# Patient Record
Sex: Female | Born: 1965 | Race: White | Hispanic: No | Smoking: Current every day smoker
Health system: Southern US, Community
[De-identification: ages and names within clinical notes are randomized; demographics above are authoritative.]

## PROBLEM LIST (undated history)

## (undated) DIAGNOSIS — F329 Major depressive disorder, single episode, unspecified: Secondary | ICD-10-CM

## (undated) DIAGNOSIS — K219 Gastro-esophageal reflux disease without esophagitis: Secondary | ICD-10-CM

## (undated) DIAGNOSIS — F32A Depression, unspecified: Secondary | ICD-10-CM

## (undated) DIAGNOSIS — F319 Bipolar disorder, unspecified: Secondary | ICD-10-CM

## (undated) HISTORY — PX: APPENDECTOMY: SHX54

## (undated) HISTORY — PX: CYSTECTOMY: SUR359

## (undated) HISTORY — PX: OOPHORECTOMY: SHX86

## (undated) HISTORY — PX: OVARIAN CYST SURGERY: SHX726

---

## 2000-02-24 ENCOUNTER — Emergency Department (HOSPITAL_COMMUNITY): Admission: EM | Admit: 2000-02-24 | Discharge: 2000-02-24 | Payer: Self-pay | Admitting: Emergency Medicine

## 2000-05-29 ENCOUNTER — Other Ambulatory Visit: Admission: RE | Admit: 2000-05-29 | Discharge: 2000-05-29 | Payer: Self-pay | Admitting: *Deleted

## 2001-09-18 ENCOUNTER — Emergency Department (HOSPITAL_COMMUNITY): Admission: EM | Admit: 2001-09-18 | Discharge: 2001-09-19 | Payer: Self-pay | Admitting: Emergency Medicine

## 2002-03-10 ENCOUNTER — Encounter: Payer: Self-pay | Admitting: Physical Medicine and Rehabilitation

## 2002-03-10 ENCOUNTER — Ambulatory Visit (HOSPITAL_COMMUNITY)
Admission: RE | Admit: 2002-03-10 | Discharge: 2002-03-10 | Payer: Self-pay | Admitting: Physical Medicine and Rehabilitation

## 2003-12-24 ENCOUNTER — Ambulatory Visit: Payer: Self-pay | Admitting: Family Medicine

## 2004-01-11 ENCOUNTER — Emergency Department (HOSPITAL_COMMUNITY): Admission: EM | Admit: 2004-01-11 | Discharge: 2004-01-11 | Payer: Self-pay | Admitting: Emergency Medicine

## 2004-01-12 ENCOUNTER — Ambulatory Visit (HOSPITAL_COMMUNITY): Admission: RE | Admit: 2004-01-12 | Discharge: 2004-01-12 | Payer: Self-pay | Admitting: Emergency Medicine

## 2004-02-10 ENCOUNTER — Ambulatory Visit: Payer: Self-pay | Admitting: Internal Medicine

## 2004-02-10 ENCOUNTER — Inpatient Hospital Stay (HOSPITAL_COMMUNITY): Admission: EM | Admit: 2004-02-10 | Discharge: 2004-02-12 | Payer: Self-pay | Admitting: Emergency Medicine

## 2004-02-19 ENCOUNTER — Inpatient Hospital Stay (HOSPITAL_COMMUNITY): Admission: RE | Admit: 2004-02-19 | Discharge: 2004-02-22 | Payer: Self-pay | Admitting: *Deleted

## 2004-03-24 ENCOUNTER — Ambulatory Visit (HOSPITAL_COMMUNITY): Admission: RE | Admit: 2004-03-24 | Discharge: 2004-03-24 | Payer: Self-pay | Admitting: Internal Medicine

## 2004-03-24 ENCOUNTER — Ambulatory Visit: Payer: Self-pay | Admitting: Internal Medicine

## 2006-08-14 ENCOUNTER — Emergency Department (HOSPITAL_COMMUNITY): Admission: EM | Admit: 2006-08-14 | Discharge: 2006-08-14 | Payer: Self-pay | Admitting: Emergency Medicine

## 2007-03-05 ENCOUNTER — Emergency Department (HOSPITAL_COMMUNITY): Admission: EM | Admit: 2007-03-05 | Discharge: 2007-03-05 | Payer: Self-pay | Admitting: Emergency Medicine

## 2007-09-19 ENCOUNTER — Emergency Department (HOSPITAL_COMMUNITY): Admission: EM | Admit: 2007-09-19 | Discharge: 2007-09-19 | Payer: Self-pay | Admitting: Emergency Medicine

## 2007-12-27 ENCOUNTER — Inpatient Hospital Stay (HOSPITAL_COMMUNITY): Admission: EM | Admit: 2007-12-27 | Discharge: 2007-12-30 | Payer: Self-pay | Admitting: Emergency Medicine

## 2007-12-30 ENCOUNTER — Ambulatory Visit: Payer: Self-pay | Admitting: *Deleted

## 2007-12-30 ENCOUNTER — Inpatient Hospital Stay (HOSPITAL_COMMUNITY): Admission: AD | Admit: 2007-12-30 | Discharge: 2008-01-02 | Payer: Self-pay | Admitting: *Deleted

## 2008-09-02 ENCOUNTER — Emergency Department (HOSPITAL_COMMUNITY): Admission: EM | Admit: 2008-09-02 | Discharge: 2008-09-02 | Payer: Self-pay | Admitting: Emergency Medicine

## 2008-09-03 ENCOUNTER — Emergency Department (HOSPITAL_COMMUNITY): Admission: EM | Admit: 2008-09-03 | Discharge: 2008-09-04 | Payer: Self-pay | Admitting: Emergency Medicine

## 2009-06-23 ENCOUNTER — Encounter (INDEPENDENT_AMBULATORY_CARE_PROVIDER_SITE_OTHER): Payer: Self-pay | Admitting: *Deleted

## 2009-06-23 ENCOUNTER — Ambulatory Visit: Payer: Self-pay | Admitting: Internal Medicine

## 2009-06-23 ENCOUNTER — Encounter: Payer: Self-pay | Admitting: Gastroenterology

## 2009-06-23 DIAGNOSIS — R1310 Dysphagia, unspecified: Secondary | ICD-10-CM | POA: Insufficient documentation

## 2009-06-23 DIAGNOSIS — R5381 Other malaise: Secondary | ICD-10-CM

## 2009-06-23 DIAGNOSIS — R5383 Other fatigue: Secondary | ICD-10-CM

## 2009-06-23 DIAGNOSIS — R1012 Left upper quadrant pain: Secondary | ICD-10-CM

## 2009-06-23 DIAGNOSIS — Z860100 Personal history of colon polyps, unspecified: Secondary | ICD-10-CM | POA: Insufficient documentation

## 2009-06-23 DIAGNOSIS — R6889 Other general symptoms and signs: Secondary | ICD-10-CM

## 2009-06-23 DIAGNOSIS — R1013 Epigastric pain: Secondary | ICD-10-CM

## 2009-06-26 LAB — CONVERTED CEMR LAB
AST: 16 units/L (ref 0–37)
Albumin: 4.4 g/dL (ref 3.5–5.2)
Alkaline Phosphatase: 63 units/L (ref 39–117)
Basophils Relative: 1 % (ref 0–1)
HCT: 44.2 % (ref 36.0–46.0)
Lymphocytes Relative: 36 % (ref 12–46)
Lymphs Abs: 2.9 10*3/uL (ref 0.7–4.0)
MCV: 90 fL (ref 78.0–100.0)
Monocytes Absolute: 0.4 10*3/uL (ref 0.1–1.0)
Monocytes Relative: 5 % (ref 3–12)
Neutro Abs: 4.6 10*3/uL (ref 1.7–7.7)
Neutrophils Relative %: 56 % (ref 43–77)
Platelets: 238 10*3/uL (ref 150–400)
RBC: 4.91 M/uL (ref 3.87–5.11)

## 2009-07-09 ENCOUNTER — Ambulatory Visit: Payer: Self-pay | Admitting: Internal Medicine

## 2009-07-09 ENCOUNTER — Ambulatory Visit (HOSPITAL_COMMUNITY): Admission: RE | Admit: 2009-07-09 | Discharge: 2009-07-09 | Payer: Self-pay | Admitting: Internal Medicine

## 2009-07-12 ENCOUNTER — Encounter: Payer: Self-pay | Admitting: Internal Medicine

## 2009-08-31 ENCOUNTER — Telehealth (INDEPENDENT_AMBULATORY_CARE_PROVIDER_SITE_OTHER): Payer: Self-pay

## 2010-03-16 NOTE — Letter (Signed)
Summary: TCS/EGD ORDER  TCS/EGD ORDER   Imported By: Ave Filter 06/23/2009 11:41:10  _____________________________________________________________________  External Attachment:    Type:   Image     Comment:   External Document

## 2010-03-16 NOTE — Letter (Signed)
Summary: Patient Notice, Colon Biopsy Results  [facility]  [address]   [place], [place] [address]   Phone: [phone]  Fax: [phone]       Jul 12, 2009   [name] [address] [place], [place]  [address] 08/16/1965    Dear [name],  I am pleased to inform you that the biopsies taken during your recent colonoscopy did not show any evidence of cancer upon pathologic examination.  Additional information/recommendations:  You should have a repeat colonoscopy examination  in 5 years.  Please call us if you are having persistent problems or have questions about your condition that have not been fully answered at this time.  Sincerely,    R. Roetta Sessions MD, FACP [facility] Ph: [phone]    Fax: [phone]   Appended Document: Patient Notice, Colon Biopsy Results Letter mailed to pt.  Appended Document: Patient Notice, Colon Biopsy Results reminder in computer

## 2010-03-16 NOTE — Letter (Signed)
Summary: Out of Work Note  [facility]  [address]   [place], [place] [address]   Phone: [phone]  Fax: [phone]    06/23/2009  TO: [name] IT MAY CONCERN  RE: [name] [address] [place] [birth year]       The above named individual is currently under my care and will be out of work    FROM: 06/23/2009   THROUGH: 06/24/2009      If you have any further questions or need additional information, please call.     Sincerely,     [name] R. Roetta Sessions, M.D.    Jonette Eva, M.D. Lorenza Burton, FNP-BC    Tana Coast, PA-C Phone: [phone]    Fax: [phone]

## 2010-03-16 NOTE — Progress Notes (Signed)
Summary: PA for Aciphex?  Phone Note From Pharmacy Call back at [facility] Phone [phone]   Caller: [facility] Summary of Call: pharmacy called- pts insurance wont pay for Aciphex. Formulary are dexilant and nexium.  We only have documentation that pt has taken protonix. tried to call pt to see if ever taken the formulary drugs. LM for return call Initial call taken by: Hendricks Limes LPN,  August 31, 2009 5:00 PM     Appended Document: PA for Aciphex? phone number for insurance PA is [phone]- [facility]  Appended Document: PA for Aciphex? tried to call pt- LMOM  Appended Document: PA for Aciphex? Spoke with Irving Burton at [facility]- informed her that I have been trying to contact pt to get the above information and pt will not return phone call.

## 2010-03-16 NOTE — Letter (Signed)
Summary: Out of Work Note  [facility]  [address]   [place], [place] [address]   Phone: [phone]  Fax: [phone]    06/23/2009  TO: [name] IT MAY CONCERN  RE: [name] [address] [place] [birth year]       The above named individual is currently under my care and will be out of work    FROM: 07/09/2009   THROUGH: 07/10/2009      If you have any further questions or need additional information, please call.     Sincerely,     [facility] R. Roetta Sessions, M.D.    Jonette Eva, M.D. Lorenza Burton, FNP-BC    Tana Coast, PA-C Phone: [phone]    Fax: [phone]

## 2010-03-16 NOTE — Assessment & Plan Note (Signed)
Summary: DIFFICULTY SWALLOWING,CONSULT FOR TCS/SS   Visit Type:  New patient Primary Care Provider:  [facility]  Chief Complaint:  difficulty swallowing and time for tcs.  History of Present Illness: [name] is a pleasant 45 y/o WF, who presents to schedule surveillance TCS. She also has h/o dysphagia. She started having problems with solid food dysphagia one year ago. The last several months, food has been sticking and she coughs it back up. She is having difficulty with liquids now as well. She c/o heartburn on ranitidine. Used to be on protonix but missed some appts at [facility] so RX not renewed. She c/o epig/LUQ pain since 7/10 when she presented to ED with n/v/d. She thinks she broke a rib. CT A/P at that time (7/10) showed apparent mild jejunal wall thickening but felt to most likely be related to lack of enteric contrast opacification.  Mild enteritis (most likely infectious) cannot be excluded but is felt less likely.  Mild fatty infiltration of the liver. Distal esophageal wall thickening which suggests esophagitis.  She denies ongoing n/v. She has 3-4 BMs daily, mostly loose stool but occasionally solid. Denies nocturnal symptoms. No melena, brbpr. No weight loss.    Current Medications (verified): 1)  Xanax .... .5mg  As Needed 2)  Ranitidine Hcl 150 Mg Caps (Ranitidine Hcl) .... As Needed 3)  Lamictal 100 Mg Tabs (Lamotrigine) .... Once Daily 4)  Seroquel 100 Mg Tabs (Quetiapine Fumarate) .... Once Daily 5)  Percocet 5-325 Mg Tabs (Oxycodone-Acetaminophen) .... Three Times A Day 6)  Fish Oil 1000 Mg Caps (Omega-3 Fatty Acids) .... Once Daily 7)  Multivitamins  Tabs (Multiple Vitamin) .... Once Daily 8)  Acetaminophen 500 Mg Tabs (Acetaminophen) .... 3 Per Day 9)  Ibuprofen 200 Mg Tabs (Ibuprofen) .... As Needed, But Significant Amounts, 3-4 Per Day 10)  Goodys Body Pain 500-325 Mg Pack (Aspirin-Acetaminophen) .... Prn  Allergies (verified): 1)  ! Morphine  Past  History:  Past Medical History: Bipolar Disorder Voluntary Behavioral Med adm, 2009. Cocaine relapse, intentional drug overdose (Neurontin) GERD TCS, 2/06 --> normal TCS, 2001--> "precancerous polyps" W-S EGD 12/05-->noncritical Schatzki ring, s/p 3F, mild erosive relux esophagitis, mild chronic gastropathy Chronic right SI joint pain  due to complicated delivery of last child  Past Surgical History: Appendectomy, 2001 during pregnancy Tubal Ligation Hysterectomy, right SOO, 2006 Ovarian cystectomy, 1999 Left SOO, due to cyst.    Family History: Two Cousins with Crohn's Mother, diverticulosis, colon polyps Paternal grandfather, cirrhosis  Social History: 3 daughters. Married. Mindi Slicker. 1ppd. No alcohol use. Recovery cocaine addict, last use 2009.  Review of Systems General:  Complains of fatigue; denies fever, chills, sweats, anorexia, weakness, and weight loss. Eyes:  Denies vision loss. ENT:  Complains of difficulty swallowing; denies nasal congestion, sore throat, and hoarseness. CV:  Denies chest pains, angina, palpitations, dyspnea on exertion, and peripheral edema. Resp:  Denies dyspnea at rest, dyspnea with exercise, cough, sputum, and wheezing. GI:  See HPI. GU:  Denies urinary burning and blood in urine. MS:  Complains of joint pain / LOM. Derm:  Denies rash and itching. Neuro:  Denies weakness, frequent headaches, memory loss, and confusion. Psych:  Complains of depression and anxiety; denies suicidal ideation. Endo:  Complains of cold intolerance; denies unusual weight change. Heme:  Denies bruising and bleeding. Allergy:  Denies hives and rash.  Vital Signs:  Patient profile:   45 year old female Height:      70 inches Weight:  194 pounds BMI:     27.94 Temp:     97.7 degrees F oral Pulse rate:   60 / minute BP sitting:   122 / 84  (left arm) Cuff size:   regular  Vitals Entered By: Hendricks Limes LPN (Jun 23, 2009 10:49 AM)  Physical  Exam  General:  Well developed, well nourished, no acute distress. Head:  Normocephalic and atraumatic. Eyes:  Conjunctivae pink, no scleral icterus.  Mouth:  Oropharyngeal mucosa moist, pink.  No lesions, erythema or exudate.    Neck:  Supple; no masses or thyromegaly. Lungs:  Clear throughout to auscultation. Heart:  Regular rate and rhythm; no murmurs, rubs,  or bruits. Abdomen:  Soft. Positive BS. Moderate epig and LUQ tenderness to deep palpation. No HSM or masses. No abd bruit or hernia. No rebound or guarding.  Rectal:  deferred until time of colonoscopy.   Extremities:  No clubbing, cyanosis, edema or deformities noted. Neurologic:  Alert and  oriented x4;  grossly normal neurologically. Skin:  Intact without significant lesions or rashes. Cervical Nodes:  No significant cervical adenopathy. Psych:  Alert and cooperative. Normal mood and affect.  Impression & Recommendations:  Problem # 1:  COLONIC POLYPS, ADENOMATOUS, HX OF (ICD-V12.72) Due for surveillance TCS. Given polypharmacy, will schedule TCS in OR. Colonoscopy to be performed in near future.  Risks, alternatives, and benefits including but not limited to the risk of reaction to medication, bleeding, infection, and perforation were addressed.  Patient voiced understanding and provided verbal consent.  Orders: T-CBC w/Diff [id number]) New Patient Level III (09811)  Problem # 2:  DYSPHAGIA UNSPECIFIED (ICD-787.20) H/O erosive reflux esophagitis and prior Schatzki ring. She has refractory GERD on H2blocker. Get her back on PPI. Aciphex 20mg  by mouth daily, #20 samples given. EGD/ED to be performed in near future.  Risks, alternatives, benefits including but not limited to risk of reaction to medications, bleeding, infection, and perforation addressed.  Patient voiced understanding and verbal consent obtained. Procedures in OR due to polypharmacy. Orders: New Patient Level III ([id number])  Problem # 3:  EPIGASTRIC PAIN  (ICD-789.06) Nearly one year h/o epigastric pain/LUQ pain. CT 7/10 findings as above. Initial w/u to include EGD and labs. She is on significant amount of NSAIDS/ASA and therefore at risk of PUD. Orders: T-CBC w/Diff [id number]) T-Hepatic Function [phone]) New Patient Level III (28413)  Problem # 4:  FATIGUE (ICD-780.79) She has fatigue and cold intolerance. R/O anemia and thyroid disease. Recommend she schedule f/u appt at [facility].  Orders: T-CBC w/Diff [id number]) T-TSH [id number]) New Patient Level III [phone])

## 2010-05-23 LAB — URINALYSIS, ROUTINE W REFLEX MICROSCOPIC
Glucose, UA: NEGATIVE mg/dL
Nitrite: NEGATIVE
Nitrite: NEGATIVE
Protein, ur: 30 mg/dL — AB
Urobilinogen, UA: 0.2 mg/dL (ref 0.0–1.0)
pH: 6 (ref 5.0–8.0)

## 2010-05-23 LAB — COMPREHENSIVE METABOLIC PANEL
ALT: 20 U/L (ref 0–35)
ALT: 25 U/L (ref 0–35)
AST: 27 U/L (ref 0–37)
AST: 27 U/L (ref 0–37)
Albumin: 4.3 g/dL (ref 3.5–5.2)
Albumin: 4.5 g/dL (ref 3.5–5.2)
Alkaline Phosphatase: 69 U/L (ref 39–117)
CO2: 26 mEq/L (ref 19–32)
Calcium: 9.7 mg/dL (ref 8.4–10.5)
Chloride: 108 mEq/L (ref 96–112)
Chloride: 109 mEq/L (ref 96–112)
Creatinine, Ser: 0.54 mg/dL (ref 0.4–1.2)
Creatinine, Ser: 0.7 mg/dL (ref 0.4–1.2)
GFR calc non Af Amer: >60 mL/min (ref >60)
Glucose, Bld: 137 mg/dL — ABNORMAL HIGH (ref 70–99)
Potassium: 3 mEq/L — ABNORMAL LOW (ref 3.5–5.1)
Potassium: 3.4 mEq/L — ABNORMAL LOW (ref 3.5–5.1)
Sodium: 141 mEq/L (ref 135–145)
Sodium: 142 mEq/L (ref 135–145)
Total Protein: 7.7 g/dL (ref 6.0–8.3)

## 2010-05-23 LAB — DIFFERENTIAL
Basophils Absolute: 0.1 10*3/uL (ref 0.0–0.1)
Basophils Relative: 0 % (ref 0–1)
Eosinophils Absolute: 0 10*3/uL (ref 0.0–0.7)
Eosinophils Absolute: 0 10*3/uL (ref 0.0–0.7)
Lymphocytes Relative: 10 % — ABNORMAL LOW (ref 12–46)
Lymphs Abs: 1.1 10*3/uL (ref 0.7–4.0)
Monocytes Absolute: 0.1 10*3/uL (ref 0.1–1.0)
Monocytes Relative: 1 % — ABNORMAL LOW (ref 3–12)
Neutrophils Relative %: 89 % — ABNORMAL HIGH (ref 43–77)

## 2010-05-23 LAB — URINE MICROSCOPIC-ADD ON

## 2010-05-23 LAB — LIPASE, BLOOD
Lipase: 22 U/L (ref 11–59)
Lipase: 31 U/L (ref 11–59)

## 2010-05-23 LAB — CBC
HCT: 43.8 % (ref 36.0–46.0)
Hemoglobin: 15.3 g/dL — ABNORMAL HIGH (ref 12.0–15.0)
MCHC: 34.3 g/dL (ref 30.0–36.0)
MCV: 89.6 fL (ref 78.0–100.0)
RDW: 13.4 % (ref 11.5–15.5)
RDW: 13.5 % (ref 11.5–15.5)
WBC: 11.2 10*3/uL — ABNORMAL HIGH (ref 4.0–10.5)

## 2010-05-23 LAB — URINE CULTURE: Colony Count: 40000

## 2010-06-29 NOTE — Group Therapy Note (Signed)
[name], [name]:  [id number]   MEDICAL RECORD NO.:  [id number]          PATIENT TYPE:  INP   LOCATION:  A308                          FACILITY:  [facility]   PHYSICIAN:  Dorris Singh, DO    DATE OF BIRTH:  [birth year]   DATE OF PROCEDURE:  12/29/2007  DATE OF DISCHARGE:                                 PROGRESS NOTE   Patient seen today with husband in the room, states she is feeling  better.  We are currently awaiting her placement for inpatient  behavioral therapy.  She has no complaints today.   Temperature 98.2, pulse 82, respirations 18, blood pressure 131/86.  GENERALLY:  The patient is well-developed, well-nourished, no acute  distress.  HEART:  Regular rate and rhythm.  LUNGS:  Clear to auscultation bilaterally.  ABDOMEN:  Soft, nontender, nondistended.  EXTREMITIES:  positive pulses.   LABS:  Her CBC is within normal limits and her chemistry, sodium is 142,  potassium is 4.6, chloride 109, CO2 27, glucose 115, BUN 8 and  creatinine 0.69.   ASSESSMENT/PLAN:  1. Intentional drug overdose.  2. Polysubstance abuse.  3. History of chronic pain.  4. History of panic attacks.  5. History of depression.  6. History question of bipolar disease.   PLAN:  Will be to continue to monitor patient, await ACT team  recommendations for transfer to behavioral health.  Currently she is  medically stable and we just await transfer.      Dorris Singh, DO  Electronically Signed     CB/MEDQ  D:  12/29/2007  T:  12/29/2007  Job:  [id number]
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
## 2010-06-29 NOTE — Discharge Summary (Signed)
NAMETORUNN, CHANCELLOR             ACCOUNT NO.:  [id number]   MEDICAL RECORD NO.:  [id number]          PATIENT TYPE:  INP   LOCATION:  A308                          FACILITY:  [facility]   PHYSICIAN:  Dorris Singh, DO    DATE OF BIRTH:  July 25, 1965   DATE OF ADMISSION:  12/27/2007  DATE OF DISCHARGE:  11/15/2009LH                               DISCHARGE SUMMARY   ADMISSION DIAGNOSES:  1. Intentional drug overdose.  2. Polysubstance abuse.  3. History of chronic pain.  4. History of panic attacks.  5. History of depression.  6. History of questionable bipolar disease.   DISCHARGE DIAGNOSES:  1. Intentional drug overdose.  2. Polysubstance abuse.  3. Suicide ideation.  4. History of chronic pain.  5. History of panic attack.  6. History of depression.   PRIMARY CARE PHYSICIAN:  Dr. Pamelia Hoit.   CONSULT:  Include the ACT team.   TESTING DONE:  She did have any.   /PRESENTING ILLNESS:  Please refer to H&P.  The patient is a 45 year old  Caucasian female who presented with intentional drug overdose.  She took  an unknown amount of Neurontin and states that she took approximately 2  Xanax tablets.   HOSPITAL COURSE:  The patient was admitted to the service of [facility]  and she was placed in the intensive care unit due to positive drug  screen that was noted for being positive for cocaine and her urine drug  screen was positive for benzodiazepines, cocaine, opiates and  tetrahydrocannabinol.  She was seen by the ACT team when she was seen on  day one of admission.  She was very lethargic, however, previous to  seeing  the ACT team representative, she was talking to her husband and,  when she was seen by the ACT team representative, she would not open up  her eyes and would fall asleep.  It was recommended that she be  reevaluated the next day.  From a medical standpoint, the patient  remained to be stable.  Her blood work remained within normal limits and  range.  She  was seen by the ACT team who recommended the next day, on  December 28, 2007,  inpatient therapy for her.  On December 30, 2007, a  bed was obtained at [facility] and  she will be shipped there.   MEDICATIONS PRIOR TO ADMISSION:  1. Effexor XR 75 mg twice a day.  2. Seroquel 100 mg once a day.  3. Xanax 0.5 mg as needed.  4. Climara patch topically.  5. Neurontin no dose given.  6. Percocet.  We will not send her on any medications to [facility] other than  what is listed above.  It will be up to Behavioral Health to continue  those or not.   PHYSICAL EXAM:  Today, she was seen resting comfortably in bed.  She had  no new complaints.  Temperature 98.1, pulse 81, respirations 16, blood pressure 107/68.  GENERAL: The patient is well-developed, well-nourished in no distress.  HEART:  Regular rate and rhythm.  LUNGS: Clear  to auscultation bilaterally.  ABDOMEN:  Soft, nontender.  EXTREMITIES:  Positive pulses.   LABS FOR TODAY:  She did not have any labs done for December 30, 2007  because she has been stable.   DISPOSITION:  Will be to [facility] and her condition  is stable.      Dorris Singh, DO  Electronically Signed     CB/MEDQ  D:  12/30/2007  T:  12/30/2007  Job:  [id number]   cc:   Gloriajean Dell. Andrey Campanile, M.D.  Fax: [phone]

## 2010-07-02 NOTE — Consult Note (Signed)
[name], [name]             ACCOUNT NO.:  [id number]   MEDICAL RECORD NO.:  [id number]          PATIENT TYPE:  INP   LOCATION:  A340                          FACILITY:  [facility]   PHYSICIAN:  R. Roetta Sessions, M.D. DATE OF BIRTH:  1966/01/27   DATE OF CONSULTATION:  02/11/2004  DATE OF DISCHARGE:                                   CONSULTATION   REQUESTING PHYSICIAN:  Dr. Butler Denmark.   REASON FOR CONSULTATION:  Acute gastroenteritis.   HISTORY OF PRESENT ILLNESS:  The patient is a 45 year old Caucasian female  who was admitted with a 6-day history of nausea, vomiting and diarrhea. She  describes the symptoms as acute onset. She denies any chronic nausea,  vomiting, or abdominal pain. She does have a history of IBS and has  postprandial loose stools. Five to six days ago, she developed epigastric  pain. This was followed by several episodes of vomiting. Denies any  hematemesis. Symptoms have persisted the last five to six days. She has also  had diarrhea with this. She is having multiple watery stools daily. Denies  any melena or hematochezia. She complains of diffuse abdominal soreness as  well. She also had a recent ED visit on January 11, 2004 for right lower  quadrant abdominal pain. At that time, she had an ultrasound which revealed  uterine fibroid, 3 x 2.5 x 2.7 solid mass versus hemorrhagic right cyst. She  tells me she has irregular menstrual cycles which are very heavy. She is  actually scheduled for hysterectomy in January. Upon this hospital visit,  she had a CT which revealed probable tiny hepatic cysts, 2.6 cm probable  mildly hemorrhagic right ovarian cyst which was smaller in size than  previous ultrasound. Acute abdominal films revealed mild bronchitic changes.  On admission, her white count was 19,500. Hemoglobin, LFTs, lipase, BUN, and  creatinine normal. Urinalysis was negative. HCG was negative. Today, her  white count is 9,800. She continues to complain of  abdominal pain, nausea,  and vomiting. She has only had one episode of diarrhea since she came on the  floor. This stool was not collected for scheduled studies. She denies any  recent antibiotic use. She does consume well water. She has no ill contacts.  She reports hemoccult negative stool done in the ED.   MEDICATIONS PRIOR TO ADMISSION:  1.  Motrin 800 mg t.i.d.  2.  Effexor 50 mg q.d.  3.  Prevacid 30 mg q.d.  4.  Lortab p.r.n.  5.  Xanax 0.5 mg t.i.d.  6.  Phenergan p.r.n.   ALLERGIES:  MORPHINE caused swelling of the arm in which it was given.   PAST MEDICAL HISTORY:  She has gastroesophageal reflux disease. Her last EGD  was five to six years ago. She does not recall findings. She has had  previously colonoscopies, the last one four years ago. She does have a  history of precancerous polyps and was told to have it done every five  years. She gives a history of IBS with diarrhea. She has depression, chronic  right SI joint pain due to complicated delivery of her  last child. She has  radiculopathy into her right leg. She has been by physical therapy and in  the pain clinic. She takes ibuprofen 800 mg t.i.d. with Lortab p.r.n. for  pain.   PAST SURGICAL HISTORY:  Tubal ligation. She had ovarian cyst removed in  1999. She had a left ovary removed in 1999 due to cyst. She had appendectomy  in 2001 while pregnant with her last child.   FAMILY HISTORY:  She has two cousins with Crohn's disease.   SOCIAL HISTORY:  She is married. She has 3 daughters. 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 She smokes a half pack cigarettes daily. She rarely  consumes alcohol. Denies any illicit drugs.   REVIEW OF SYSTEMS:  See HPI for GI. CARDIOPULMONARY:  Denies any chest pain  or shortness of breath. See HPI for GU.   PHYSICAL EXAMINATION:  VITAL SIGNS:  Height 70 inches, weight 186.7,  temperature 98.8, pulse 76,  respirations 20, blood pressure 99/41.  GENERAL:  Pleasant, well-developed, well-nourished, Caucasian female in no  acute distress.  SKIN:  Warm and dry. No jaundice.  HEENT:  Pupils are equal, round, and reactive to light. Conjunctivae are  pink. Sclerae are nonicteric. Oropharyngeal mucosa somewhat dry. No erythema  or exudate. No lymphadenopathy or thyromegaly.  CHEST:  Lungs are clear to auscultation.  CARDIAC:  Reveals regular rate and rhythm. Normal S1 and S2. No murmurs,  rubs, or gallops.  ABDOMEN:  Positive bowel sounds. Soft, nondistended. She has very mild  diffuse abdominal tenderness in the lower abdomen to deep palpation. She has  moderate epigastric tenderness to deep palpation with subjective guarding.  No rebound tenderness. No hepatosplenomegaly or masses.  EXTREMITIES:  No edema.   LABORATORY DATA:  Today, her white count is 9,800, hemoglobin 12.8,  hematocrit 36.9, platelets 297,000. BUN 7, creatinine 0.7, glucose 96,  sodium 136, potassium 4, lipase 21. Yesterday, her total bilirubin was 0.4,  alkaline phosphatase 65, AST 20, ALT 26, albumin 3.5.   IMPRESSION:  The patient is a 45 year old lady with acute onset nausea,  vomiting, and diarrhea which is probably due to acute gastroenteritis. Stool  studies have not yet been obtained. She also complains of acute epigastric  pain which I suspect is secondary to current illness. Given her significant  ibuprofen use, however, would be concerned regarding gastritis or peptic  ulcer disease although this would not explain all of her current symptoms.  It is somewhat reassuring that she does take Prevacid daily. Gallbladder  remains in situ; however, symptoms are not classical for biliary disease  either. Her last EGD was approximately four years ago. She also has a  history of precancerous colonic polyps and states that she is due for colonoscopy in 2006 for 5-year followup.   RECOMMENDATIONS:  1.  Follow up stool  studies as they are available.  2.  Continue supportive measures.  3.  Consider switching from Dilaudid as the patient complains of headache.  4.  The patient need upper endoscopy if her symptoms persist. She will need      to have an outpatient colonoscopy in the near future given history of      polyps.   I would like to thank Dr. Butler Denmark for allowing [place] to take part in the care of  this patient.     Lesl   LL/MEDQ  D:  02/11/2004  T:  02/11/2004  Job:  [id number]

## 2010-07-02 NOTE — Op Note (Signed)
NAMEANETTA, OLVERA             ACCOUNT NO.:  [id number]   MEDICAL RECORD NO.:  [id number]          PATIENT TYPE:  INP   LOCATION:  A340                          FACILITY:  [facility]   PHYSICIAN:  R. Roetta Sessions, M.D. DATE OF BIRTH:  05/08/1965   DATE OF PROCEDURE:  02/12/2004  DATE OF DISCHARGE:                                 OPERATIVE REPORT   PROCEDURE:  Esophagogastroduodenoscopy with Elease Hashimoto dilation, followed by  biopsy.   INDICATION FOR PROCEDURE:  The patient is a 45 year old lady with a long  history of gastroesophageal reflux disease, admitted to the hospital with  nausea, vomiting, and diarrhea.  She was found to have a right hemorrhagic  ovarian cyst.  Also complains of intermittent esophageal dysphagia.  EGD is  now being done to further evaluate her symptoms.  This approach has been  discussed with the patient at length, potential risks, benefits, and  alternatives have been reviewed, questions answered.  She is agreeable.  Please see the documentation in the medical record for more information.  Prior CT demonstrated a tiny hepatic cyst and the ovarian cyst as noted  above.   PROCEDURE NOTE:  O2 saturation, blood pressure, pulse, and respiration were  monitored throughout the entire procedure.   CONSCIOUS SEDATION:  Versed 7 mg IV, Demerol 125 mg IV in divided doses,  Cetacaine spray for topical pharyngeal anesthesia.   INSTRUMENT USED:  Olympus video colonoscope.   FINDINGS:  Examination of the tubular esophagus revealed a noncritical-  appearing Schatzki's ring and multiple tiny distal esophageal erosions.  The  esophageal mucosa otherwise appeared normal.  EG junction easily traversed.   Stomach:  The gastric cavity was empty and insufflated well with air.  A  thorough examination of the gastric mucosa, including a retroflexed view of  the proximal stomach and esophagogastric junction, was undertaken.  Patient  noted to have an intensely erythematous 2 x 4  cm patch of gastric mucosa in  the prepyloric antral mucosa.  There was no erosion or ulcer craters seen.  Pylorus was patent and easily traversed.  Examination of the bulb and second  portion appeared normal.   Therapeutic/diagnostic maneuvers performed:  A 56 French Maloney dilator was  passed to full insertion.  Subsequently the area of abnormal-appearing  gastric mucosa was biopsied for histologic study.  No apparent complication  related to passage of the dilator was seen.  The patient tolerated the  procedure well and was reacted in endoscopy.   IMPRESSION:  1.  Noncritical-appearing Schatzki's ring.  2.  Tiny distal esophageal erosion consistent with mild erosive reflux      esophagitis, status post Maloney dilation described above.  3.  Patch of intensely erythematous gastric mucosa of uncertain      significance, biopsied.  The remainder of the gastric mucosa appeared      normal.  4  Normal D1, D2.   RECOMMENDATIONS:  1.  Increase proton pump inhibitor therapy to Protonix 40 mg orally b.i.d.      for a month, then back off to once daily.  2.  Advance diet as tolerated.  3.  I agree with GYN evaluation of her right ovarian cyst.  4.  Outpatient colonoscopy at the first of the year to follow up on history      of colonic polyps.  5.  Would avoid nonsteroidal agents as much as possible.     Otelia Sergeant   RMR/MEDQ  D:  02/12/2004  T:  02/12/2004  Job:  [id number]   cc:   Calvert Cantor, M.D.

## 2010-07-02 NOTE — H&P (Signed)
[name], [name]             ACCOUNT NO.:  [id number]   MEDICAL RECORD NO.:  [id number]          PATIENT TYPE:  INP   LOCATION:  A340                          FACILITY:  [facility]   PHYSICIAN:  Margaretmary Dys, M.D.DATE OF BIRTH:  1965-11-14   DATE OF ADMISSION:  02/10/2004  DATE OF DISCHARGE:  LH                                HISTORY & PHYSICAL   ADMISSION DIAGNOSES:  1.  Acute abdominal pain.  2.  Acute gastroenteritis.  3.  Chronic pain syndrome.  4.  Mildly hemorrhagic right ovarian cyst.   CHIEF COMPLAINT:  Nausea, vomiting, diarrhea about six days duration.   HISTORY OF PRESENT ILLNESS:  The patient is a 45 year old Caucasian female  who presented to the emergency room complaining of abdominal pain with  nausea and vomiting. She reports that abdominal pain that is epigastric in  nature, sharp, and nonradiating. She rates it initially as 10/10 when she  came to the hospital but now down to about 6/10 after she received pain  medications. The pain lasted for about three to five minutes before getting  better. She is not aware of any aggravating factors. Pain is nonradiating.  The patient's pain tends to resolve in a waxing and waning manner  spontaneously.   The patient's symptoms started about six days ago but has progressively  worsened. There is similar history in her family, and there is no history of  anyone else sick in the family, even though she has little kids at home.   She states most of her vomitus is yellowish. Denies any blood. She also  reports having loose bowel movements. She says she does have irritable bowel  syndrome and diarrhea is not usual for her, but the consistency of this one  was different and was more frequent. Did not have any abdominal cramping.  Did not have any blood in it. She denies any fever or chills or rigors. She  has no headache, dizziness, or lightheadedness. She denies any myalgia. She  has no frequency, urgency or dysuria. Has  not hematuria. She denies any  shortness of breath. No chest pain. No paroxysmal nocturnal dyspnea. No  orthopnea.   Evaluation in the emergency room including a CT of the abdomen and pelvis  was essentially unremarkable except for a resolving ovarian cyst. The  patient was also noted to have leukocytosis and a left shift, but there was  no clear source of infection other than probable from her diarrhea.   PAST MEDICAL HISTORY:  1.  Chronic back pain.  2.  Anxiety.  3.  Depression.  4.  Status post appendectomy.  5.  Bilateral tubal ligation.  6.  Left ovariectomy.  7.  The patient is scheduled for hysterectomy and also right ovariectomy in      the next several weeks.  8.  Gastroesophageal reflux disease.   CURRENT MEDICATIONS:  1.  Xanax 0.5 mg p.o. t.i.d. p.r.n.  2.  Effexor 50 mg p.o. daily.  3.  Prevacid 30 mg p.o. daily.  4.  Ibuprofen 800 mg up to 6 times a day.  5.  Oxycodone 10  mg 1 to 2 tablets as needed a day.   ALLERGIES:  MORPHINE causes swelling.   FAMILY HISTORY:  Noncontributory.   SOCIAL HISTORY:  The patient is married, has 3 children, the youngest is  about 46 years old. She currently is not employed. She used to be an  ultrasound tech. She is not disability at this time. She lives in  [place]. She smokes about half a pack a day, has about 20-pack-year  history of smoking. Denies any IV drug use.   PHYSICAL EXAMINATION:  GENERAL:  Conscious, alert, pleasant, in mild pain  distress.  VITAL SIGNS:  Blood pressure 149/91, pulse 111, respiratory rate 20,  temperature 96.9.  HEENT:  Normocephalic, atraumatic. Oral mucosa was dry. No exudates was  noted.  NECK:  Supple. No JVD. No lymphadenopathy.  LUNGS:  Clear clinically. Good air entry bilaterally.  HEART:  S1 and S2 regular. No S3, S4, gallops, or rubs.  ABDOMEN:  There is mild epigastric tenderness, was soft, nondistended. There  was no rigidity. No guarding. Bowel sounds were positive.   EXTREMITIES:  No pitting pedal edema. No calf induration or tenderness was  noted.  CENTRAL NERVOUS SYSTEM:  Grossly intact with no focal deficits.   LABORATORY/DIAGNOSTIC STUDIES:  White blood cell count was 19.5, hemoglobin  15.1, hematocrit 44, platelet count 334. There was a left shift with  neutrophils of 90%. Sodium 141, potassium 5.0, chloride 107, CO2 25, glucose  112, BUN 13, creatinine 0.6. Liver function tests were normal. Lipase was  normal at 35. Urinalysis was negative.   CT scan of the abdomen and pelvis shows 1-cm probably resolving hemorrhagic  right ovarian cyst. The patient was noted to have clear lung bases. She was  noted to have tiny liver cysts; otherwise was reported to be a normal exam.   Chest x-ray shows mild bronchitic changes with no acute abnormality.   ASSESSMENT/PLAN:  [name] is a 45 year old Caucasian female who  presented with acute abdominal pain, nausea, vomiting, diarrhea of about six  days duration. It does appear that the patient has some kind of acute  gastroenteritis. She does have elevated white count with left shift. She  does not have a fever though. Would admit and hydrate her at this time.  Would check stools for clostridium difficile and WBC. The patient's lipase  is negative, and her liver function tests are normal. Her exam is not  suggestive of acute cholecystitis at this time. Hence, we will not proceed  with an ultrasound of gallbladder which would have had more sensitivity than  a CT scan, but again, I do not see any indication for this.   I am mostly concerned about acute gastritis in this patient. She has been  taking ibuprofen about 4,000 mg over the last 4 years. I am going to request  gastroenterology consult to evaluate her and probably prepare her for an  endoscopy.   The patient does have resolving right hemorrhagic ovarian cyst. The patient says she was scheduled to have surgery. I do not think this is causing her   pain as anatomically pain is more superior. I will request O&G consult.   We will control pain with Dilaudid, nausea and vomiting with Zofran and  Phenergan. I will continue the patient on anxiolytics. Will give Protonix.   We will repeat lipase in the morning. We will also recheck her CBC in the  morning. I do not see any indication to start any antibiotics at this time.  DVT prophylaxis will be with a sequential compression device while in bed.   I have discussed the above plan with the patient who verbalized full  understanding.     Ayor   AM/MEDQ  D:  02/11/2004  T:  02/11/2004  Job:  [id number]

## 2010-07-02 NOTE — Op Note (Signed)
[name], [name]:  [id number]   MEDICAL RECORD NO.:  [id number]         PATIENT TYPE:  INP   LOCATION:  A409                          FACILITY:  [facility]   PHYSICIAN:  Langley Gauss, MD          DATE OF BIRTH:   DATE OF PROCEDURE:  02/19/2004  DATE OF DISCHARGE:                                 OPERATIVE REPORT   DIAGNOSES:  1.  Menometrorrhagia.  2.  Dysmenorrhea.  3.  Right ovarian cyst.  4.  Mixed-type incontinence.   PROCEDURE PERFORMED:  Total abdominal hysterectomy, right salpingo-  oophorectomy.   SURGEON:  Langley Gauss, MD   ESTIMATED BLOOD LOSS:  200 mL.   ANESTHESIA:  General endotracheal.   SPECIMENS:  For permanent section only.   DRAINS:  JP is placed in the subcutaneous space.  A Foley catheter is  sterilely draining clear yellow urine.   Findings at the time of surgery include adhesive disease and the left adnexa  with the left tube and ovary noted to be surgically absent.  The right ovary  is noted to contain a 3 cm follicular cyst.  The uterine fundus is noted to  be diffusely enlarged.   SUMMARY:  The patient was taken to the operating room, where vital signs  were stable.  The patient underwent uncomplicated induction of general  endotracheal anesthesia, after which time she is sterilely prepped and  draped, a Foley catheter is placed to straight drainage with findings of  clear yellow urine.  A knife is used to incise a Pfannenstiel incision  through the skin.  We dissected down to the fascial plane utilizing a sharp  knife, cauterizing bleeders along the way.  The fascia was then incised in a  transverse curvilinear manner while dissecting off the underlying rectus  muscle in the avascular plane.  This is performed bilaterally.  The edges of  the rectus fascia were then grasped first superiorly, then inferiorly, and  then dissection performed in the midline in the avascular plane between the  fascia and the underlying  rectus muscle to improve our operative exposure.  Rectus muscles then bluntly separated, the peritoneal cavity is  atraumatically bluntly entered at the superiormost portion of the incision.  The peritoneal incision is then extended superiorly and inferiorly.  Inferiorly we directly visualized the bladder to avoid its accidental  injury.  Intrapelvic examination at this time reveals the diffusely  enlarged, bulbous-shaped uterus, surgical absence of the left tube and  ovary, adhesive disease between the large bowel and the left pelvic sidewall  as well as the left fundal portion of the uterus.  The right ovary contains  the previously-described follicular cyst.  Under direct visualization sharp  dissection is then performed in the left adnexal region in the avascular  plane to mobilize the large colon off of the uterine fundus as well as the  left vaginal sidewall.  This is done atraumatically in an avascular plane  with no apparent bowel injury and complete mobilization of the colon at that  point in time.  A Balfour self-retaining retractor is then  placed as well as  two moist packs to mobilize the bowel out of the operative field.  The  bladder blade is  also placed.  The specimen is then identified.  Long  straight Kocher clamps are then used to grasp the broad ligament and  fallopian tubes at their junction with the uterine fundus as well as  manipulation of the specimen throughout the operative procedure.  The left  round ligament is identified first.  It is clamped with a Kelly clamp,  followed by a suture ligature of 0 Vicryl in a Heaney fashion.  The left  round ligament is transected and the dissection is continued anteriorly  across the anterior lower uterine segment in the avascular plane.  This is  to allow skeletonization of the uterine vessel on the patient's left and is  continued anteriorly across the lower uterine segment, which allows  mobilization of the bladder flap  from the vesicouterine fold.  I then turned  my attention to the patient's right side.  The right round ligament is  identified.  It is clamped with a Kelly clamp, followed by a suture ligature  of 0 Vicryl in a Heaney fashion.  Transection of the right round ligament  then allows skeletonization of the right infundibulopelvic ligament.  The  ureter is noted to be in its normal anatomic position along the lateral  pelvic sidewall and free from harm's way.  After skeletonization of the  right infundibulopelvic ligament, it is then doubly clamped with Kelly  clamps, followed by doubly being ligated, first with a 0 Vicryl free tie,  followed by a suture ligature of 0 Vicryl in a Heaney fashion.  This secures  the right infundibulopelvic ligament and allows the ovary and fallopian tube  to be removed with the operative specimen.  On the right side in the  avascular plane, the right uterine vessel is skeletonized.  The right  uterine vessel is identified.  A Kelly clamp is placed to control back  bleeding.  A curved Heaney clamp is then placed to secure the uterine vessel  on the right, placed at the junction of the cervix and   Dictation ended at this point.     Vira Blanco   DC/MEDQ  D:  02/19/2004  T:  02/19/2004  Job:  [id number]

## 2010-07-02 NOTE — Discharge Summary (Signed)
NAMEDELONNA, NEY             ACCOUNT NO.:  [id number]   MEDICAL RECORD NO.:  [id number]          PATIENT TYPE:  INP   LOCATION:  A340                          FACILITY:  [facility]   PHYSICIAN:  Calvert Cantor, M.D.     DATE OF BIRTH:  [birth year]   DATE OF ADMISSION:  02/10/2004  DATE OF DISCHARGE:  12/29/2005LH                                 DISCHARGE SUMMARY   GASTROENTEROLOGIST:  Jonathon Bellows, M.D.   DISCHARGE DIAGNOSIS:  1.  Acute gastroenteritis.  2. Gastroesophageal reflux disease causing      esophagitis.   DISCHARGE MEDICATIONS:  1.  Protonix 40 mg q.12h.  2. Avoid NSAIDs.   HOSPITAL COURSE:  This is a 45 year old white female who was admitted for  nausea and vomiting and diarrhea.  On admission, a CAT scan was done showing  resolving hemorrhagic cyst.  The patient was started on IV fluids and a GI  consult was placed.  Pain control was initiated with Dilaudid.  However, the  patient stated that Dilaudid gave her a headache.  Therefore, the next day  when she mentioned this it was changed to Demerol.  Nausea and vomiting was  controlled with Zofran and Phenergan.  Protonix was started for GI  protection.  Her white count which was initially 19.5 dropped down to 9.8 on  the following day and 7.6 on the day of discharge.  Her hemoglobin initially  15.1, dropped down to 12.8 and 12.3 which was probably secondary to  hemodilution.  This is probably her baseline.  MCV on discharge was 89.4.  Platelets were 282.  Sodium on discharge was 141, potassium was 4.1,  chloride 108, bicarbonate 28, glucose 94, BUN 4, creatinine 0.7.  LFTs were  normal.  Lipase was normal.  The patient had an EGD by Dr. Jena Gauss.  The  results are as follows:  1. Noncritical-appearing Schatzki's ring.  2. Tiny  distal esophageal erosion consistent with mild erosive reflux esophagitis  and a patch of intensely erythematous gastric mucosa of uncertain  significance which was biopsied.  It is recommended  that her proton pump  inhibitor be increased to twice a day and her diet be advanced as tolerated.  The patient was able to tolerate dinner that night.  Her nausea and vomiting  had improved.  So had her diarrhea.  This was most likely secondary to  gastroenteritis.   FOLLOW UP:  She is to follow up with Dr. Jena Gauss for the biopsy results.  She  is to follow up with her OB/GYN for her hemorrhagic ovarian cyst.  Dr. Jena Gauss  also plans on doing a colonoscopy soon because of her history of colon  polyps.  I have told her that she is to avoid NSAIDs.     Saim   SR/MEDQ  D:  02/13/2004  T:  02/13/2004  Job:  [id number]

## 2010-07-02 NOTE — Op Note (Signed)
[name], [name] NO.:  [id number]   MEDICAL RECORD NO.:  [id number]         PATIENT TYPE:  INP   LOCATION:  A409                          FACILITY:  [facility]   PHYSICIAN:  Langley Gauss, MD     DATE OF BIRTH:  04-03-1965   DATE OF PROCEDURE:  02/19/2004  DATE OF DISCHARGE:                                 OPERATIVE REPORT   ADDENDUM:  The right infundibulopelvic ligament is secured with the Vicryl  suture. Skeletization of the right uterine vessel was performed.  A curved  Heaney clamp was placed at the junction of the cervix and the body of the  uterus, placing it as closed as possible to this portion of the specimen. A  single ligature of #0 Vicryl suture is then placed to secure the right  uterine vessel.  The left uterine vessel had previously been skeletonized.  A single curved Heaney clamp is placed at the left uterine vessel at the  junction of the cervix and body of the uterus itself. This pedicle is then  secured with a #0 Vicryl suture.  The major vascular supply to the uterus  has now been secured, thus there is significant blanching and marked  decreased blood loss noted from backbleeding from the specimen.   The patient's right is then visualized.  A straight Heaney clamp is placed  to secure the cardinal ligament followed by suture ligature of #0 Vicryl in  a Heaney fashion.  Likewise on the left a straight Heaney clamp is used to  secure the left cardinal ligament followed by suture ligature of #0 Vicryl  in a Heaney fashion.  These were placed very close as possible to the cervix  itself.   The right uterosacral ligament is then clamped with a straight Heaney clamp  followed by suture ligature of #0 Vicryl in a Heaney fashion. This is tagged  for a later use.  The left ureterosacral ligament was likewise clamped with  a straight Heaney clamp followed by suture ligature of #0 Vicryl in a Heaney  fashion; this is likewise tagged for later  inspection. The right vaginal  angle is then identified.  A curved Heaney clamp is placed here to secure  the vaginal angle which is then, likewise, tagged for later inspection.  This then allows me to dissect across the vaginal cuff, maximizing cervical  length, the entirety of the cervix with the operative specimen.  The  specimen is then examined.  The cervix is noted to be contained in its  entirety with the handed off specimen.   The vaginal cuff was then grasped utilizing straight Kocher clamps.  Irrigation was performed of the upper vagina with Betadine solution and the  vaginal cuff was completely closed utilizing sequential four figure-of-eight  sutures of #0 Vicryl. An additional figure-of-eight of #0 Vicryl was placed  to reenforce the vaginal cuff at the level of the right vaginal angle.  The  bladder flap was noted to fall nicely over; and the copious irrigation then  performed until clear.  The Balfour retractors were removed as well as moist  packs.  Sponge, needle, and instrument counts were correct x2 at this point.  Thus the peritoneal edges were grasped using Kelly clamps.  Large bowel with  overlying omentum is placed within the pelvic cavity.  The peritoneum is  then closed with a continuous running #0 Vicryl suture which is likewise  noted to reapproximated the rectus muscle in the midline.  The fascia was  closed using running #1 PDS suture for secure closure.  The subcutaneous  bleeders are cauterized.  A JP drain is placed in the subcutaneous space  __________ #1 PDS were then placed through-and-through the skin to  facilitate closure and function as retention-type sutures. The skin is then  completely closed utilizing the skin staples.  A total of about 30 mL of  0.5%  bupivacaine plain was then injected along the entirety of the incision.  Operative procedure was then terminated.  The patient's vital signs remain  stable.  She is reversed of anesthesia; taken to  the recovery room in stable  condition at which time operative findings were discussed with the patient's  awaiting family.     [name]   DC/MEDQ  D:  02/19/2004  T:  02/19/2004  Job:  [id number]

## 2010-07-02 NOTE — H&P (Signed)
NAMEMARICE, [name]:  [id number]   MEDICAL RECORD NO.:  [id number]           PATIENT TYPE:   LOCATION:                                FACILITY:  [facility]   PHYSICIAN:  Langley Gauss, MD     DATE OF BIRTH:  [birth year]   DATE OF ADMISSION:  02/19/2004  DATE OF DISCHARGE:  LH                                HISTORY & PHYSICAL   ADDENDUM TO HISTORY AND PHYSICAL:  Date of admission is to be February 19, 2004.  The patient is to be admitted for planned total abdominal  hysterectomy right salpingo-oophorectomy.  The patient does have a history  of a genuine stress urinary incontinence as well as urge incontinence.  She  has been seen by Dr. Rito Ehrlich in consultation regarding a bladder suspension  procedure, however due to changes in their practice, and recent illness in  the patient, she has not been seen for followup visit, thus at this point in  time urology is not at the point in the workup where they can proceed with  the previously planned [facility] procedure.  This discussed with the patient  that we can proceed with the total abdominal hysterectomy right salpingo-  oophorectomy, but I will not be performing any type of bladder suspension  procedure.  The patient is also given the option of canceling and  rescheduling the surgery so that the urological procedure can be performed.  After giving these options consideration, the patient at this time elects to  proceed with the scheduled total abdominal hysterectomy right salpingo-  oophorectomy and at a later date if the stress incontinence continues to be  problematic, she can seek urological evaluation at that time.  Thus planned  procedure February 19, 2004 total abdominal hysterectomy, right salpingo-  oophorectomy.     Vira Blanco   DC/MEDQ  D:  02/17/2004  T:  02/17/2004  Job:  [id number]

## 2010-07-02 NOTE — Op Note (Signed)
[name], [name]             ACCOUNT NO.:  [id number]   MEDICAL RECORD NO.:  [id number]          PATIENT TYPE:  AMB   LOCATION:  DAY                           FACILITY:  [facility]   PHYSICIAN:  R. Roetta Sessions, M.D. DATE OF BIRTH:  [birth year]   DATE OF PROCEDURE:  03/24/2004  DATE OF DISCHARGE:                                 OPERATIVE REPORT   PROCEDURE:  Surveillance colonoscopy.   INDICATIONS FOR PROCEDURE:  The patient is a 45 year old lady with a history  of precancerous polyps found on colonoscopy in [place] five years  ago. She is here for surveillance. She is devoid of any lower GI tract  symptoms. Colonoscopy is now being done. This approach has been discussed  with the patient at length. Potential risks, benefits, and alternatives have  been reviewed and questions answered. Please see my handwritten H&P for more  information.   PROCEDURE NOTE:  O2 saturation, blood pressure, pulse, and respirations  monitored throughout the entirety of the procedure. Conscious sedation with  Versed 9 mg IV and Demerol 100 mg IV in divided doses.   INSTRUMENT:  Olympus video chip system.   FINDINGS:  Digital rectal examination revealed no abnormalities.   ENDOSCOPIC FINDINGS:  Prep was good.   Rectum:  Examination of the rectal mucosa including retroflexed view of anal  verge revealed no abnormalities.   Colon:  Colonic mucosa was surveyed from the rectosigmoid junction through  the left, transverse, and right colon to area of the appendiceal orifice,  ileocecal valve, and cecum. These structures were well seen and photographed  for the record. Olympus video scope was slowly withdrawn, and all previously  mentioned mucosal surfaces were again seen. I initially used the adult  colonoscope and was unable to advance beyond 40 cm due to an acute turn and  noncompliance of the colon, in spite of turning patient's position and using  external abdominal pressure. I withdrew the adult  scope and obtained a  pediatric colonoscope to complete the exam. It is notable the rectal mucosa  and colonic mucosa appeared entirely normal. The patient tolerated the  procedure well and was reactive to endoscopy.   IMPRESSION:  1.  Normal rectum.  2.  Normal colon.   RECOMMENDATIONS:  Repeat colonoscopy in five years.      RMR/MEDQ  D:  03/24/2004  T:  03/24/2004  Job:  [id number]

## 2010-07-02 NOTE — Discharge Summary (Signed)
Christina, [name]:  [id number]   MEDICAL RECORD NO.:  [id number]          PATIENT TYPE:  INP   LOCATION:  A409                          FACILITY:  [facility]   PHYSICIAN:  Langley Gauss, MD     DATE OF BIRTH:  15-Apr-1965   DATE OF ADMISSION:  02/19/2004  DATE OF DISCHARGE:  01/08/2006LH                                 DISCHARGE SUMMARY   OPERATION/PROCEDURE:  Total abdominal hysterectomy, right salpingo-  oophorectomy with the primary diagnosis of menometrorrhagia, dysmenorrhea,  left lower quadrant pain secondary to ovarian cyst, mixed urinary  incontinence.   DISPOSITION:  The patient is doing well with a Climara patch in place.  She  will follow up in the office in three days for staple removal from the  Pfannenstiel incision.  The JP drain is aspirated and removed on the day of  discharge.  She was given a copy of standard discharge instructions at the  time of discharge.   PERTINENT LABORATORY DATA:  Admission hemoglobin and hematocrit 15.1 and 44,  white count 19.5, postoperative. Postoperative day #1 reveals hemoglobin  12.8, hematocrit 37.3 with a white count of 12.5 and on the day of  discharge, or postoperative day #3 hemoglobin 10.9, hematocrit 32.3 with a  white count of 9.7.  Electrolytes noted to be within normal limits also on  day of discharge.   HOSPITAL COURSE:  The patient was noted to have surgically absence of the  left adnexal region.  She is admitted on February 19, 2004.  A TAH and RSO was  performed without complications.  The patient had the operative procedure  performed without difficulty on February 19, 2004.  Postoperatively she had a  Foley catheter placed. She also had PCA Dilaudid for pain relief and JP  drain in the subcutaneous space.  She  did remain afebrile during the entire  postoperative course but the postoperative course was complicated and  extended by one day secondary to ileus and resultant nausea.  The patient  continued with the Dilaudid PCA with good results.  Excellent pain relief.  She became ambulatory on postoperative day #1.  The Foley catheter was  removed and the patient was able to ambulate and void without difficulty.  The PCA was put on hold and the patient was tried on p.o. Tylox for pain  relief.  The patient initially did well with the p.o. Tylox.  However, she  was seen late in the p.m. on February 20, 2004 at which time she had onset of  fairly significant and severe gas pains.  She was having significant amounts  of nausea; thus she was unable to tolerate p.o. medicines, so that PCA pump  was restarted and the patient was continued on clear liquids for bowel rest.  She required the PCA pump throughout the day February 21, 2004 at which time  she was noted to have fairly significant improvement in her overall  symptoms.  On evaluation in the a.m. of February 22, 2004, the patient now has  near complete resolution of her nausea.  She has begun passing flatus.  Abdomen  is soft and nondistended.  Thus, she is tried on p.o. Dilaudid for  pain relief, given 4 mg p.o. q.4h. p.r.n. for postoperative pain.  If she  does well with this, expect her to be discharged on today's date, February 22, 2004 with a prescription for p.o. Dilaudid.     Vira Blanco  DC/MEDQ  D:  02/22/2004  T:  02/22/2004  Job:  [id number]

## 2010-11-12 LAB — URINALYSIS, ROUTINE W REFLEX MICROSCOPIC
Bilirubin Urine: NEGATIVE
Nitrite: NEGATIVE
Protein, ur: NEGATIVE
Specific Gravity, Urine: 1.025
pH: 6

## 2010-11-12 LAB — BASIC METABOLIC PANEL
BUN: 7
Chloride: 111
GFR calc Af Amer: >60
GFR calc non Af Amer: >60
Glucose, Bld: 128 — ABNORMAL HIGH
Potassium: 3.3 — ABNORMAL LOW
Sodium: 141

## 2010-11-12 LAB — DIFFERENTIAL
Eosinophils Relative: 0
Lymphocytes Relative: 8 — ABNORMAL LOW
Monocytes Relative: 2 — ABNORMAL LOW

## 2010-11-12 LAB — CBC
HCT: 44
Hemoglobin: 14.7
MCHC: 33.3
WBC: 7

## 2010-11-12 LAB — URINE MICROSCOPIC-ADD ON

## 2010-11-12 LAB — LIPASE, BLOOD: Lipase: 26

## 2010-11-12 LAB — HEPATIC FUNCTION PANEL: Total Bilirubin: 0.5

## 2010-11-16 LAB — COMPREHENSIVE METABOLIC PANEL
ALT: 13
AST: 13
Albumin: 3.3 — ABNORMAL LOW
Albumin: 3.5
Alkaline Phosphatase: 54
Alkaline Phosphatase: 58
BUN: 6
BUN: 8
Calcium: 8.1 — ABNORMAL LOW
Calcium: 8.9
Chloride: 109
Creatinine, Ser: 0.62
GFR calc Af Amer: >60
Glucose, Bld: 112 — ABNORMAL HIGH
Glucose, Bld: 115 — ABNORMAL HIGH
Potassium: 3.3 — ABNORMAL LOW
Potassium: 4.6
Sodium: 142
Total Bilirubin: 0.4
Total Protein: 5.4 — ABNORMAL LOW
Total Protein: 6.4

## 2010-11-16 LAB — SALICYLATE LEVEL: Salicylate Lvl: <4

## 2010-11-16 LAB — DIFFERENTIAL
Basophils Absolute: 0
Basophils Absolute: 0.1
Basophils Relative: 0
Basophils Relative: 1
Eosinophils Absolute: 0.1
Eosinophils Relative: 1
Lymphocytes Relative: 25
Lymphs Abs: 2.3
Monocytes Absolute: 0.4
Monocytes Absolute: 0.4
Monocytes Relative: 4
Monocytes Relative: 5
Neutro Abs: 7.5
Neutro Abs: 7.8 — ABNORMAL HIGH
Neutrophils Relative %: 70

## 2010-11-16 LAB — CBC
HCT: 40
HCT: 42.6
Hemoglobin: 13.6
Hemoglobin: 14.5
MCHC: 34.1
MCHC: 34.3
MCV: 89.8
Platelets: 222
Platelets: 224
RDW: 13.2
RDW: 13.5
WBC: 9.2
WBC: 9.8

## 2010-11-16 LAB — CARDIAC PANEL(CRET KIN+CKTOT+MB+TROPI)
CK, MB: 0.7
Relative Index: INVALID
Total CK: 53
Troponin I: <0.01

## 2010-11-16 LAB — RAPID URINE DRUG SCREEN, HOSP PERFORMED
Amphetamines: NOT DETECTED
Barbiturates: NOT DETECTED
Benzodiazepines: POSITIVE — AB
Cocaine: POSITIVE — AB
Opiates: POSITIVE — AB

## 2011-07-28 ENCOUNTER — Emergency Department (HOSPITAL_COMMUNITY)
Admission: EM | Admit: 2011-07-28 | Discharge: 2011-07-28 | Disposition: A | Discharge status: Home / Self Care | Payer: Self-pay | Attending: Emergency Medicine | Admitting: Emergency Medicine

## 2011-07-28 ENCOUNTER — Encounter (HOSPITAL_COMMUNITY): Payer: Self-pay

## 2011-07-28 ENCOUNTER — Emergency Department (HOSPITAL_COMMUNITY): Payer: Self-pay

## 2011-07-28 DIAGNOSIS — X58XXXA Exposure to other specified factors, initial encounter: Secondary | ICD-10-CM | POA: Insufficient documentation

## 2011-07-28 DIAGNOSIS — S82892A Other fracture of left lower leg, initial encounter for closed fracture: Secondary | ICD-10-CM

## 2011-07-28 DIAGNOSIS — S82899A Other fracture of unspecified lower leg, initial encounter for closed fracture: Secondary | ICD-10-CM | POA: Insufficient documentation

## 2011-07-28 DIAGNOSIS — F17200 Nicotine dependence, unspecified, uncomplicated: Secondary | ICD-10-CM | POA: Insufficient documentation

## 2011-07-28 MED ORDER — ONDANSETRON 8 MG PO TBDP
8.0000 mg | ORAL_TABLET | Freq: Once | ORAL | Status: AC
  Administered 2011-07-28: 8 mg via ORAL
  Filled 2011-07-28: qty 1

## 2011-07-28 MED ORDER — OXYCODONE-ACETAMINOPHEN 5-325 MG PO TABS
2.0000 | ORAL_TABLET | Freq: Once | ORAL | Status: AC
  Administered 2011-07-28: 2 via ORAL
  Filled 2011-07-28: qty 2

## 2011-07-28 MED ORDER — MELOXICAM 7.5 MG PO TABS: ORAL_TABLET | ORAL | Status: DC

## 2011-07-28 MED ORDER — OXYCODONE-ACETAMINOPHEN 5-325 MG PO TABS: 1.0000 | ORAL_TABLET | Freq: Four times a day (QID) | ORAL | Status: AC | PRN

## 2011-07-28 NOTE — Discharge Instructions (Signed)
You have a fracture of the fibula (small bone in the ankle). Please keep the left ankle elevated above her waist. Apply ice until seen by the orthopedic physician. Use her crutches, and do not put weight on the splint. Keep the splint dry. Mobic 7.5 mg twice daily with a meal for swelling and inflammation. Percocet every 6 hours as needed for pain. This medication may cause drowsiness, use with caution.Ankle Fracture A fracture is a break in the bone. A cast or splint is used to protect and keep your injured bone from moving.  HOME CARE INSTRUCTIONS   Use your crutches as directed.   To lessen the swelling, keep the injured leg elevated while sitting or lying down.   Apply ice to the injury for 15 to 20 minutes, 3 to 4 times per day while awake for 2 days. Put the ice in a plastic bag and place a thin towel between the bag of ice and your cast.   If you have a plaster or fiberglass cast:   Do not try to scratch the skin under the cast using sharp or pointed objects.   Check the skin around the cast every day. You may put lotion on any red or sore areas.   Keep your cast dry and clean.   If you have a plaster splint:   Wear the splint as directed.   You may loosen the elastic around the splint if your toes become numb, tingle, or turn cold or blue.   Do not put pressure on any part of your cast or splint; it may break. Rest your cast only on a pillow the first 24 hours until it is fully hardened.   Your cast or splint can be protected during bathing with a plastic bag. Do not lower the cast or splint into water.   Take medications as directed by your caregiver. Only take over-the-counter or prescription medicines for pain, discomfort, or fever as directed by your caregiver.   Do not drive a vehicle until your caregiver specifically tells you it is safe to do so.   If your caregiver has given you a follow-up appointment, it is very important to keep that appointment. Not keeping the  appointment could result in a chronic or permanent injury, pain, and disability. If there is any problem keeping the appointment, you must call back to this facility for assistance.  SEEK IMMEDIATE MEDICAL CARE IF:   Your cast gets damaged or breaks.   You have continued severe pain or more swelling than you did before the cast was put on.   Your skin or toenails below the injury turn blue or gray, or feel cold or numb.   There is a bad smell or new stains and/or purulent (pus like) drainage coming from under the cast.  If you do not have a window in your cast for observing the wound, a discharge or minor bleeding may show up as a stain on the outside of your cast. Report these findings to your caregiver. MAKE SURE YOU:   Understand these instructions.   Will watch your condition.   Will get help right away if you are not doing well or get worse.  Document Released: 01/29/2000 Document Revised: 01/20/2011 Document Reviewed: 09/04/2007 [facility] Patient Information 2012 [place], [place].

## 2011-07-28 NOTE — ED Provider Notes (Signed)
History     CSN: [id number]  Arrival date & time 07/28/11  1144   None     Chief Complaint  Patient presents with  . Ankle Pain    (Consider location/radiation/quality/duration/timing/severity/associated sxs/prior treatment) HPI Comments: Pt states she jumped in to a pool that was more shallow than she suspected. She injured theleft ankle. Sh has had pain an swelling. No improvement  After ibuprofen. No other injury.  The history is provided by the patient.    History reviewed. No pertinent past medical history.  Past Surgical History  Procedure Date  . Cystectomy   . Appendectomy   . Ovarian cyst surgery   . Oophorectomy     No family history on file.  History  Substance Use Topics  . Smoking status: Current Everyday Smoker  . Smokeless tobacco: Not on file  . Alcohol Use: Yes    OB History    Grav Para Term Preterm Abortions TAB SAB Ect Mult Living                  Review of Systems  Constitutional: Negative for activity change.       All ROS Neg except as noted in HPI  HENT: Negative for nosebleeds and neck pain.   Eyes: Negative for photophobia and discharge.  Respiratory: Negative for cough, shortness of breath and wheezing.   Cardiovascular: Negative for chest pain and palpitations.  Gastrointestinal: Negative for abdominal pain and blood in stool.  Genitourinary: Negative for dysuria, frequency and hematuria.  Musculoskeletal: Negative for back pain and arthralgias.  Skin: Negative.   Neurological: Negative for dizziness, seizures and speech difficulty.  Psychiatric/Behavioral: Negative for hallucinations and confusion.    Allergies  Morphine  Home Medications  No current outpatient prescriptions on file.  BP 127/79  Pulse 93  Temp 98.4 F (36.9 C) (Oral)  Resp 16  Ht 5\' 9"  (1.753 m)  Wt 165 lb (74.844 kg)  BMI 24.37 kg/m2  SpO2 100%  Physical Exam  Nursing note and vitals reviewed. Constitutional: She is oriented to person, place,  and time. She appears well-developed and well-nourished.  Non-toxic appearance.  HENT:  Head: Normocephalic.  Right Ear: Tympanic membrane and external ear normal.  Left Ear: Tympanic membrane and external ear normal.  Eyes: EOM and lids are normal. Pupils are equal, round, and reactive to light.  Neck: Normal range of motion. Neck supple. Carotid bruit is not present.  Cardiovascular: Normal rate, regular rhythm, normal heart sounds, intact distal pulses and normal pulses.   Pulmonary/Chest: Breath sounds normal. No respiratory distress.  Abdominal: Soft. Bowel sounds are normal. There is no tenderness. There is no guarding.  Musculoskeletal: Normal range of motion.       There is pain and swelling to attempted range of motion and palpation of the left ankle. The distal pulses are symmetrical. Capillary refill is less than 3 seconds. The Achilles tendon is intact. There is full range of motion of the left knee and hip.  Lymphadenopathy:       Head (right side): No submandibular adenopathy present.       Head (left side): No submandibular adenopathy present.    She has no cervical adenopathy.  Neurological: She is alert and oriented to person, place, and time. She has normal strength. No cranial nerve deficit or sensory deficit.  Skin: Skin is warm and dry.  Psychiatric: She has a normal mood and affect. Her speech is normal.    ED Course  Procedures (  including critical care time)  Labs Reviewed - No data to display Dg Ankle Complete Left  07/28/2011  *RADIOLOGY REPORT*  Clinical Data: Ankle pain.  LEFT ANKLE COMPLETE - 3+ VIEW  Comparison: No priors.  Findings: Three views of the left ankle demonstrate a subtle obliquely oriented nondisplaced fracture through the distal fibular metadiaphysis.  The distal tibia appears intact, as does the talus. Anatomic alignment appears preserved at the ankle mortise.  There is extensive soft tissue swelling overlying the lateral malleolus.  IMPRESSION:  1.  Nondisplaced oblique fracture through the distal fibular metadiaphysis with overlying soft tissue swelling.  Original Report Authenticated By: Florencia Reasons, M.D.     No diagnosis found.    MDM  I have reviewed nursing notes, vital signs, and all appropriate lab and imaging results for this patient. Patient jumped in a pool on June 12 and injured the left foot and ankle. The x-ray of the left ankle reveals a nondisplaced oblique fracture through the distal fibula. The patient was fitted with a posterior splint and crutches. Prescription for Percocet one every 4 hours was given for pain as well as Mobic 7.5 mg. Patient is to see orthopedics for evaluation of this problem.       Kathie Dike, [place] 07/28/11 2137

## 2011-07-28 NOTE — ED Notes (Signed)
Pt states, " I jumped into the pool yesterday and it was too shallow." left ankle swollen.

## 2011-08-09 NOTE — ED Provider Notes (Signed)
Medical screening examination/treatment/procedure(s) were performed by non-physician practitioner and as supervising physician I was immediately available for consultation/collaboration.   Jannifer Fischler L Tonika Eden, MD 08/09/11 0729 

## 2013-06-19 ENCOUNTER — Emergency Department (HOSPITAL_COMMUNITY)
Admission: EM | Admit: 2013-06-19 | Discharge: 2013-06-19 | Disposition: A | Discharge status: Home / Self Care | Payer: Self-pay | Attending: Emergency Medicine | Admitting: Emergency Medicine

## 2013-06-19 ENCOUNTER — Emergency Department (HOSPITAL_COMMUNITY): Payer: Self-pay

## 2013-06-19 ENCOUNTER — Encounter (HOSPITAL_COMMUNITY): Payer: Self-pay | Admitting: Emergency Medicine

## 2013-06-19 DIAGNOSIS — K529 Noninfective gastroenteritis and colitis, unspecified: Secondary | ICD-10-CM

## 2013-06-19 DIAGNOSIS — F17200 Nicotine dependence, unspecified, uncomplicated: Secondary | ICD-10-CM | POA: Insufficient documentation

## 2013-06-19 DIAGNOSIS — Z791 Long term (current) use of non-steroidal anti-inflammatories (NSAID): Secondary | ICD-10-CM | POA: Insufficient documentation

## 2013-06-19 DIAGNOSIS — F3289 Other specified depressive episodes: Secondary | ICD-10-CM | POA: Insufficient documentation

## 2013-06-19 DIAGNOSIS — Z9089 Acquired absence of other organs: Secondary | ICD-10-CM | POA: Insufficient documentation

## 2013-06-19 DIAGNOSIS — Z79899 Other long term (current) drug therapy: Secondary | ICD-10-CM | POA: Insufficient documentation

## 2013-06-19 DIAGNOSIS — F329 Major depressive disorder, single episode, unspecified: Secondary | ICD-10-CM | POA: Insufficient documentation

## 2013-06-19 DIAGNOSIS — K219 Gastro-esophageal reflux disease without esophagitis: Secondary | ICD-10-CM | POA: Insufficient documentation

## 2013-06-19 DIAGNOSIS — K5289 Other specified noninfective gastroenteritis and colitis: Secondary | ICD-10-CM | POA: Insufficient documentation

## 2013-06-19 DIAGNOSIS — R748 Abnormal levels of other serum enzymes: Secondary | ICD-10-CM | POA: Insufficient documentation

## 2013-06-19 HISTORY — DX: Major depressive disorder, single episode, unspecified: F32.9

## 2013-06-19 HISTORY — DX: Gastro-esophageal reflux disease without esophagitis: K21.9

## 2013-06-19 HISTORY — DX: Depression, unspecified: F32.A

## 2013-06-19 LAB — COMPREHENSIVE METABOLIC PANEL
ALT: 212 U/L — AB (ref 0–35)
AST: 93 U/L — AB (ref 0–37)
Albumin: 4.4 g/dL (ref 3.5–5.2)
Alkaline Phosphatase: 83 U/L (ref 39–117)
BUN: 17 mg/dL (ref 6–23)
CALCIUM: 9.7 mg/dL (ref 8.4–10.5)
CO2: 23 mEq/L (ref 19–32)
CREATININE: 0.8 mg/dL (ref 0.50–1.10)
Chloride: 102 mEq/L (ref 96–112)
GFR calc Af Amer: >90 mL/min (ref >90)
GFR calc non Af Amer: 86 mL/min — ABNORMAL LOW (ref >90)
Glucose, Bld: 112 mg/dL — ABNORMAL HIGH (ref 70–99)
Potassium: 3.2 mEq/L — ABNORMAL LOW (ref 3.7–5.3)
SODIUM: 142 meq/L (ref 137–147)
TOTAL PROTEIN: 7.9 g/dL (ref 6.0–8.3)
Total Bilirubin: 0.4 mg/dL (ref 0.3–1.2)

## 2013-06-19 LAB — CBC WITH DIFFERENTIAL/PLATELET
BASOS PCT: 0 % (ref 0–1)
Basophils Absolute: 0 10*3/uL (ref 0.0–0.1)
Eosinophils Absolute: 0 10*3/uL (ref 0.0–0.7)
Eosinophils Relative: 0 % (ref 0–5)
HEMATOCRIT: 45.6 % (ref 36.0–46.0)
HEMOGLOBIN: 15.3 g/dL — AB (ref 12.0–15.0)
LYMPHS ABS: 3.4 10*3/uL (ref 0.7–4.0)
Lymphocytes Relative: 26 % (ref 12–46)
MCH: 28.7 pg (ref 26.0–34.0)
MCHC: 33.6 g/dL (ref 30.0–36.0)
MCV: 85.4 fL (ref 78.0–100.0)
MONO ABS: 0.8 10*3/uL (ref 0.1–1.0)
MONOS PCT: 6 % (ref 3–12)
NEUTROS PCT: 68 % (ref 43–77)
Neutro Abs: 9.1 10*3/uL — ABNORMAL HIGH (ref 1.7–7.7)
Platelets: 296 10*3/uL (ref 150–400)
RBC: 5.34 MIL/uL — AB (ref 3.87–5.11)
RDW: 13.7 % (ref 11.5–15.5)
WBC: 13.5 10*3/uL — ABNORMAL HIGH (ref 4.0–10.5)

## 2013-06-19 LAB — LIPASE, BLOOD: Lipase: 56 U/L (ref 11–59)

## 2013-06-19 MED ORDER — ONDANSETRON HCL 4 MG/2ML IJ SOLN
4.0000 mg | Freq: Once | INTRAMUSCULAR | Status: AC
  Administered 2013-06-19: 4 mg via INTRAVENOUS
  Filled 2013-06-19: qty 2

## 2013-06-19 MED ORDER — SODIUM CHLORIDE 0.9 % IV BOLUS (SEPSIS)
1000.0000 mL | Freq: Once | INTRAVENOUS | Status: AC
  Administered 2013-06-19: 1000 mL via INTRAVENOUS

## 2013-06-19 MED ORDER — IOHEXOL 300 MG/ML  SOLN
50.0000 mL | Freq: Once | INTRAMUSCULAR | Status: AC | PRN
  Administered 2013-06-19: 50 mL via ORAL

## 2013-06-19 MED ORDER — GI COCKTAIL ~~LOC~~
30.0000 mL | Freq: Once | ORAL | Status: AC
  Administered 2013-06-19: 30 mL via ORAL
  Filled 2013-06-19: qty 30

## 2013-06-19 MED ORDER — FAMOTIDINE 20 MG PO TABS: 20.0000 mg | ORAL_TABLET | Freq: Two times a day (BID) | ORAL | Status: DC

## 2013-06-19 MED ORDER — IOHEXOL 300 MG/ML  SOLN
100.0000 mL | Freq: Once | INTRAMUSCULAR | Status: AC | PRN
  Administered 2013-06-19: 100 mL via INTRAVENOUS

## 2013-06-19 MED ORDER — FAMOTIDINE IN NACL 20-0.9 MG/50ML-% IV SOLN
20.0000 mg | Freq: Once | INTRAVENOUS | Status: DC
  Filled 2013-06-19: qty 50

## 2013-06-19 MED ORDER — SODIUM CHLORIDE 0.9 % IV BOLUS (SEPSIS): 1000.0000 mL | Freq: Once | INTRAVENOUS | Status: DC

## 2013-06-19 MED ORDER — PROMETHAZINE HCL 25 MG PO TABS: 25.0000 mg | ORAL_TABLET | Freq: Four times a day (QID) | ORAL | Status: DC | PRN

## 2013-06-19 NOTE — ED Notes (Signed)
Pt c/o upper abdominal pain as well as n/v. Pt denies diarrhea.

## 2013-06-19 NOTE — ED Notes (Signed)
Pt continuing to have emesis and c/o abd pain and burning. Dr. Effie ShyWentz informed.

## 2013-06-19 NOTE — Care Management Note (Signed)
ED/CM noted patient did not have health insurance and/or PCP listed in the computer.  Patient was given the [place] resource handout with information on the clinics, food pantries, and the handout for new health insurance sign-up.  Patient expressed appreciation for information received. 

## 2013-06-19 NOTE — ED Notes (Signed)
Pt also reports burning epigastric pain.

## 2013-06-19 NOTE — Discharge Instructions (Signed)
Your liver tests were elevated as follows:   AST 93     ALT 212       CT scan of abdomen and pelvis was negative.   You'll need primary care followup. Resource guide given. Medication for nausea and stomach ulcer.     Emergency Department Resource Guide 1) Find a Doctor and Pay Out of Pocket Although you won't have to find out who is covered by your insurance plan, it is a good idea to ask around and get recommendations. You will then need to call the office and see if the doctor you have chosen will accept you as a new patient and what types of options they offer for patients who are self-pay. Some doctors offer discounts or will set up payment plans for their patients who do not have insurance, but you will need to ask so you aren't surprised when you get to your appointment.  2) Contact Your Local Health Department Not all health departments have doctors that can see patients for sick visits, but many do, so it is worth a call to see if yours does. If you don't know where your local health department is, you can check in your phone book. The CDC also has a tool to help you locate your state's health department, and many state websites also have listings of all of their local health departments.  3) Find a Walk-in Clinic If your illness is not likely to be very severe or complicated, you may want to try a walk in clinic. These are popping up all over the country in pharmacies, drugstores, and shopping centers. They're usually staffed by nurse practitioners or physician assistants that have been trained to treat common illnesses and complaints. They're usually fairly quick and inexpensive. However, if you have serious medical issues or chronic medical problems, these are probably not your best option.  No Primary Care Doctor: - Call Health Connect at  [phone] - they can help you locate a primary care doctor that  accepts your insurance, provides certain services, etc. - Physician Referral Service-  [phone]  Chronic Pain Problems: Organization         Address  Phone   Notes  [facility]  [phone] Patients need to be referred by their primary care doctor.   Medication Assistance: Organization         Address  Phone   Notes  [facility] [address] [place] [place], [place] [address] [phone] --Must be a resident of [facility] -- Must have NO insurance coverage whatsoever (no Medicaid/ Medicare, etc.) -- The pt. MUST have a primary care doctor that directs their care regularly and follows them in the community   [facility]ist  [phone]   [employer]  [phone]    Agencies that provide inexpensive medical care: Organization         Address  Phone   Notes  [facility]  [phone]   [facility]    [phone]   [facility] [place] [address] [place], [place] [address] [phone]   [facility] [address] [place], [place] [phone]   Planned Parenthood    [phone]   [facility]    [phone]   [facility]  [address], [place] Phone:  [phone], Fax:  [phone] Hours of Operation:  9 am -  6 pm, M-F.  Also accepts Medicaid/Medicare and self-pay.  [facility]  [address], [place] Phone: [phone], Fax: [phone]. Hours of Operation:  8:30 am - 5:30 pm, M-F.  Also accepts Medicaid and self-pay.  [facility] [place] [address], [place] Phone: [phone]   [facility] [address] Natasha Bence [place], [place] [phone], Ext. [phone] Mondays & Thursdays: 7-9 AM.  First 15 patients are seen on a first come, first serve basis.    Medicaid-accepting [facility] Providers:  Organization         Address  Phone   Notes  [facility] [address],  [place] [phone] Also accepts self-pay patients.  [facility] [address] Laurell Josephs [place], [place]  [phone]   [facility] [address], [place] [phone]   [facility] [address] [place] [phone]   Renaye Rakers [address], [place]   [phone] Only accepts [place] patients after they have their name applied to their card.   Self-Pay (no insurance) in [facility]:  Organization         Address  Phone   Notes  Sickle Cell Patients, [facility] [address] [place], [place] [phone]   [facility] [address] [place], [place] [phone]   [facility]  [address], [place] [phone]   [facility]/Dr. Osei-Bonsu  [address], [place] or [address], [place] [phone] Phone number for both [place] and [place] locations is the same.  [facility] [address] [place] [phone]   [facility] [address] [place] or [address] [phone]   [facility] [address] [place] [phone], phone; [phone], fax Sees patients 1st and 3rd Saturday of every month.  Must not qualify for public or private insurance (i.e. Medicaid, Medicare, [place] Health Choice, Veterans' Benefits)  Household income should be no more than 200% of the poverty level The clinic cannot treat you if you are pregnant or think you are pregnant  Sexually transmitted diseases are not treated at the clinic.    Dental Care: Organization         Address  Phone  Notes  [facility] [address] [place], [place] [phone] Accepts children up to age 3 who are enrolled in  [place] or [place] Health Choice; pregnant women with a Medicaid card; and children who have applied for Medicaid or [place] Health Choice, but were declined, whose parents can pay a reduced fee at time of service.  [facility] Department of [facility]  [address], [place] [phone] Accepts children up to age 53 who are enrolled in [place] or [place] Health Choice; pregnant women with a Medicaid card; and children who have applied for Medicaid or [place] Health Choice, but were declined, whose parents can pay a reduced fee at time of service.  [facility]  [address] [place], [place] [phone] Patients are seen by appointment only. Walk-ins are not accepted. [facility] will see patients 43 years of age and  older. Monday - Tuesday (8am-5pm) Most Wednesdays (8:30-5pm) $30 per visit, cash only  [facility] [name] [facility] [facility]  [address], [facility] [phone] Patients are seen by appointment only. Walk-ins are not accepted. [facility] will see patients 48 years of age and older. One Wednesday Evening (Monthly: Volunteer Based).  $30 per visit, cash only  [employer] [facility] [facility]  [phone] for adults; Children under age 704, call Graduate Pediatric Dentistry at [phone]. Children aged 244-14, please call [phone] to request a pediatric application.  Dental services are provided in all areas of dental care including fillings, crowns and bridges, complete and partial dentures, implants, gum treatment, root canals, and extractions. Preventive care is also provided. Treatment is provided to both adults and children. Patients are selected via a lottery and there is often a waiting list.   [facility] [address], [place]  [phone] [email or web address]   [facility] [address], [place], [place] [phone], Ext. [phone] Second and Fourth Thursday of each month, opens at 6:30  AM; Clinic ends at 9 AM.  Patients are seen on a first-come first-served basis, and a limited number are seen during each clinic.   [facility]  [address], [place], [place] [phone]   Eligibility Requirements You must have lived in [place], [place], or [place] counties for at least the last three months.   You cannot be eligible for state or federal sponsored National Cityhealthcare insurance, including CIGNAVeterans Administration, [place]icaid, or [employer]icare.   You generally cannot be eligible for healthcare insurance through your employer.    How to apply: Eligibility screenings are held every Tuesday and Wednesday afternoon from 1:00 pm until 4:00 pm. You do not need an appointment for the interview!  [facility] [address], [place], [place] [phone]   [facility]  [phone]   [facility]  [phone]   [facility]  [phone]    Behavioral Health Resources in the Community: Intensive Outpatient Programs Organization         Address  Phone  Notes  [facility] [address], [place], [place] [phone]   [facility] Outpatient [address], [place], [place] [phone]   [facility] [address], [place], [place]  [phone]   [name] [address],  [place], [place] [phone] or [phone]   Substance Abuse Resources Organization         Address  Phone  Notes  [facility]  [phone]   [facility]  [phone]   [facility]  [phone]   [facility]  [phone]   Residential & Outpatient Substance Abuse Program  [phone]   Psychological Services Organization         Address  Phone  Notes  [facility]  [phone]- [phone]   [facility]  [phone]- [phone]   [facility] [address], [place] [phone] or  [phone]    Mobile Crisis Teams Organization         Address  Phone  Notes  [facility], Mobile Crisis Care Unit  [phone]   [facility]  [address] [place], [place] [phone]   [employer] In[facility]on DeEsch [address] [place] [phone]    Self-Help/Support Groups Organization         Address  Phone             Notes  [facility] [place] - variety of support groups  336- [phone] Call for more information  Narcotics Anonymous (NA), [facility] [address], [place]  2 meetings at this location   Statisticianesidential Treatment Programs Organization         Address  Phone  Notes  [facility] [address],    [place]  [phone]   [facility]  [address], [place] [phone], [place], [place] [phone]   [facility] [address], [place] [phone] Admissions: 8am-3pm M-F  [facility] [address]    [place], [place] [phone]   [facility] [address], [place], [place] [phone]   [facility] [address]  [place], [place] [phone]   [facility] - Intensive Outpatient [address], [place], [place] [phone]   [facility] ([facility] [address]  [place], [place] [phone] or [phone]   [facility] ([facility]) [address] [place], [place] [phone] Accepts Medicaid  [facility] [address]  [place] [phone] Substance Abuse/Addiction Treatment   [facility] Resources Organization         Address  Phone  Notes  [facility]  [phone]   Angie FavaJulie Brannon, PhD [address], [address] [place], [place]   [phone] or [phone]   [facility]   [address] [place], [place] [phone]) [phone]   [facility] [address],  [place], [place] [phone] Insurance/Medicaid/sponsorship through [facility]  [facility] [address]                                    [place], [place] [phone] Therapy/tele-psych/case  [facility] [address].   [place], [place] [phone]    Dr. Lolly MustacheArfeen  [phone]   [facility]  [employer] [facility] 1) [address], [place] 2) [address], [place] 3)  [address], [place] [phone]  [phone]   [facility] Child Abuse Hotline [phone]) [phone] or [phone](336) [phone] (After Hours)

## 2013-06-19 NOTE — ED Provider Notes (Signed)
CSN: [id number]     Arrival date & time 06/19/13  1259 History   First MD Initiated Contact with Patient 06/19/13 1316     Chief Complaint  Patient presents with  . Abdominal Pain  . Emesis     (Consider location/radiation/quality/duration/timing/severity/associated sxs/prior Treatment) HPI..... multiple episodes of vomiting for the past 2 days with minimal epigastric pain. No radiation of pain. No fever, chills, diarrhea, chest pain, shortness of breath. This is not a typical scenario for patient.   Severity is moderate. Nothing makes symptoms better or worse. Vomiting is nonbilious, nonbloody  Past Medical History  Diagnosis Date  . Acid reflux   . Depression    Past Surgical History  Procedure Laterality Date  . Cystectomy    . Appendectomy    . Ovarian cyst surgery    . Oophorectomy     History reviewed. No pertinent family history. History  Substance Use Topics  . Smoking status: Current Every Day Smoker  . Smokeless tobacco: Not on file  . Alcohol Use: Yes   OB History   Grav Para Term Preterm Abortions TAB SAB Ect Mult Living                 Review of Systems  All other systems reviewed and are negative.     Allergies  Morphine  Home Medications   Prior to Admission medications   Medication Sig Start Date End Date Taking? Authorizing Provider  diphenhydrAMINE (BENADRYL) 25 mg capsule Take 25 mg by mouth every 6 (six) hours as needed. For itching   Yes Historical Provider, MD  FLUoxetine (PROZAC) 20 MG capsule Take 20 mg by mouth daily.   Yes Historical Provider, MD  ibuprofen (ADVIL,MOTRIN) 200 MG tablet Take 200 mg by mouth every 6 (six) hours as needed. For pain   Yes Historical Provider, MD  lamoTRIgine (LAMICTAL) 200 MG tablet Take 200 mg by mouth daily.   Yes Historical Provider, MD  omeprazole (PRILOSEC) 20 MG capsule Take 20 mg by mouth daily.   Yes Historical Provider, MD  QUEtiapine (SEROQUEL) 100 MG tablet Take 100 mg by mouth at bedtime.   Yes  Historical Provider, MD  traZODone (DESYREL) 150 MG tablet Take 150 mg by mouth at bedtime.   Yes Historical Provider, MD  famotidine (PEPCID) 20 MG tablet Take 1 tablet (20 mg total) by mouth 2 (two) times daily. 06/19/13   Donnetta HutchingBrian Niobe Dick, MD  promethazine (PHENERGAN) 25 MG tablet Take 1 tablet (25 mg total) by mouth every 6 (six) hours as needed. 06/19/13   Donnetta HutchingBrian Kaarin Pardy, MD   BP 115/74  Pulse 81  Temp(Src) 98.5 F (36.9 C) (Oral)  Resp 20  Ht 5\' 10"  (1.778 m)  Wt 180 lb (81.647 kg)  BMI 25.83 kg/m2  SpO2 99% Physical Exam  Nursing note and vitals reviewed. Constitutional: She is oriented to person, place, and time. She appears well-developed and well-nourished.  HENT:  Head: Normocephalic and atraumatic.  Eyes: Conjunctivae and EOM are normal. Pupils are equal, round, and reactive to light.  Neck: Normal range of motion. Neck supple.  Cardiovascular: Normal rate, regular rhythm and normal heart sounds.   Pulmonary/Chest: Effort normal and breath sounds normal.  Abdominal: Soft. Bowel sounds are normal.  Musculoskeletal: Normal range of motion.  Neurological: She is alert and oriented to person, place, and time.  Skin: Skin is warm and dry.  Psychiatric: She has a normal mood and affect. Her behavior is normal.    ED Course  Procedures (including critical care time) Labs Review Labs Reviewed  COMPREHENSIVE METABOLIC PANEL - Abnormal; Notable for the following:    Potassium 3.2 (*)    Glucose, Bld 112 (*)    AST 93 (*)    ALT 212 (*)    GFR calc non Af Amer 86 (*)    All other components within normal limits  CBC WITH DIFFERENTIAL - Abnormal; Notable for the following:    WBC 13.5 (*)    RBC 5.34 (*)    Hemoglobin 15.3 (*)    Neutro Abs 9.1 (*)    All other components within normal limits  LIPASE, BLOOD    Imaging Review Ct Abdomen Pelvis W Contrast  06/19/2013   CLINICAL DATA:  Abdominal pain, nausea and vomiting.  EXAM: CT ABDOMEN AND PELVIS WITH CONTRAST  TECHNIQUE:  Multidetector CT imaging of the abdomen and pelvis was performed using the standard protocol following bolus administration of intravenous contrast.  CONTRAST:  100mL OMNIPAQUE IOHEXOL 300 MG/ML SOLN, 50mL OMNIPAQUE IOHEXOL 300 MG/ML SOLN  COMPARISON:  09/04/2008  FINDINGS: The liver, gallbladder, pancreas, spleen, adrenal glands and kidneys are within normal limits. Bowel shows no evidence of obstruction or inflammation. No abnormal fluid collections are seen.  There is no evidence of mass or enlarged lymph nodes. No hernias are identified. The bladder is unremarkable. The uterus has been removed. No vascular abnormalities are seen. Bony structures show spondylosis at the L5-S1 level with disc space narrowing and vacuum disc present.  IMPRESSION: No acute findings in the abdomen or pelvis.   Electronically Signed   By: Irish LackGlenn  Yamagata M.D.   On: 06/19/2013 15:04     EKG Interpretation None      MDM   Final diagnoses:  Gastroenteritis  Elevated liver enzymes    No acute abdomen. CT abdomen pelvis shows no acute findings. And liver functions noted to be slightly elevated. This was discussed with the patient.  Liver enzymes will need to be rechecked in the next 2-3 weeks. She feels much better after IV hydration.     Donnetta HutchingBrian Laurabeth Yip, MD 06/20/13 [id number]

## 2014-06-26 ENCOUNTER — Telehealth: Payer: Self-pay

## 2014-06-26 NOTE — Telephone Encounter (Signed)
Letter mailed to pt.  

## 2014-06-26 NOTE — Telephone Encounter (Signed)
ON RECALL FOR TCS °

## 2015-06-11 ENCOUNTER — Encounter (HOSPITAL_COMMUNITY): Payer: Self-pay | Admitting: Emergency Medicine

## 2015-06-11 ENCOUNTER — Inpatient Hospital Stay (HOSPITAL_COMMUNITY)
Admission: EM | Admit: 2015-06-11 | Discharge: 2015-06-13 | DRG: 690 | Disposition: A | Discharge status: Home / Self Care | Payer: Self-pay | Attending: Internal Medicine | Admitting: Internal Medicine

## 2015-06-11 DIAGNOSIS — F17210 Nicotine dependence, cigarettes, uncomplicated: Secondary | ICD-10-CM | POA: Diagnosis present

## 2015-06-11 DIAGNOSIS — N390 Urinary tract infection, site not specified: Secondary | ICD-10-CM

## 2015-06-11 DIAGNOSIS — R112 Nausea with vomiting, unspecified: Secondary | ICD-10-CM | POA: Diagnosis present

## 2015-06-11 DIAGNOSIS — N12 Tubulo-interstitial nephritis, not specified as acute or chronic: Principal | ICD-10-CM | POA: Diagnosis present

## 2015-06-11 DIAGNOSIS — R1110 Vomiting, unspecified: Secondary | ICD-10-CM

## 2015-06-11 DIAGNOSIS — K219 Gastro-esophageal reflux disease without esophagitis: Secondary | ICD-10-CM | POA: Diagnosis present

## 2015-06-11 DIAGNOSIS — F319 Bipolar disorder, unspecified: Secondary | ICD-10-CM | POA: Diagnosis present

## 2015-06-11 DIAGNOSIS — A599 Trichomoniasis, unspecified: Secondary | ICD-10-CM

## 2015-06-11 DIAGNOSIS — E876 Hypokalemia: Secondary | ICD-10-CM | POA: Diagnosis present

## 2015-06-11 DIAGNOSIS — A5901 Trichomonal vulvovaginitis: Secondary | ICD-10-CM | POA: Diagnosis present

## 2015-06-11 DIAGNOSIS — F1410 Cocaine abuse, uncomplicated: Secondary | ICD-10-CM

## 2015-06-11 HISTORY — DX: Bipolar disorder, unspecified: F31.9

## 2015-06-11 LAB — CBC
HCT: 49.9 % — ABNORMAL HIGH (ref 36.0–46.0)
Hemoglobin: 16.6 g/dL — ABNORMAL HIGH (ref 12.0–15.0)
MCH: 29 pg (ref 26.0–34.0)
MCHC: 33.3 g/dL (ref 30.0–36.0)
MCV: 87.2 fL (ref 78.0–100.0)
PLATELETS: 251 10*3/uL (ref 150–400)
RBC: 5.72 MIL/uL — ABNORMAL HIGH (ref 3.87–5.11)
RDW: 12.7 % (ref 11.5–15.5)
WBC: 15.8 10*3/uL — ABNORMAL HIGH (ref 4.0–10.5)

## 2015-06-11 LAB — COMPREHENSIVE METABOLIC PANEL
ALBUMIN: 4.7 g/dL (ref 3.5–5.0)
ALT: 136 U/L — ABNORMAL HIGH (ref 14–54)
ANION GAP: 15 (ref 5–15)
AST: 62 U/L — AB (ref 15–41)
Alkaline Phosphatase: 67 U/L (ref 38–126)
BILIRUBIN TOTAL: 0.8 mg/dL (ref 0.3–1.2)
BUN: 16 mg/dL (ref 6–20)
CO2: 27 mmol/L (ref 22–32)
CREATININE: 0.86 mg/dL (ref 0.44–1.00)
Calcium: 9.9 mg/dL (ref 8.9–10.3)
Chloride: 100 mmol/L — ABNORMAL LOW (ref 101–111)
GFR calc Af Amer: >60 mL/min (ref >60)
GFR calc non Af Amer: >60 mL/min (ref >60)
GLUCOSE: 125 mg/dL — AB (ref 65–99)
Potassium: 2.9 mmol/L — ABNORMAL LOW (ref 3.5–5.1)
Sodium: 142 mmol/L (ref 135–145)
TOTAL PROTEIN: 8 g/dL (ref 6.5–8.1)

## 2015-06-11 LAB — URINALYSIS, ROUTINE W REFLEX MICROSCOPIC
BILIRUBIN URINE: NEGATIVE
Glucose, UA: NEGATIVE mg/dL
KETONES UR: NEGATIVE mg/dL
NITRITE: NEGATIVE
Protein, ur: 30 mg/dL — AB
Specific Gravity, Urine: 1.025 (ref 1.005–1.030)
pH: 6 (ref 5.0–8.0)

## 2015-06-11 LAB — URINE MICROSCOPIC-ADD ON

## 2015-06-11 LAB — LIPASE, BLOOD: Lipase: 28 U/L (ref 11–51)

## 2015-06-11 MED ORDER — POTASSIUM CHLORIDE CRYS ER 20 MEQ PO TBCR
40.0000 meq | EXTENDED_RELEASE_TABLET | Freq: Once | ORAL | Status: AC
  Administered 2015-06-11: 40 meq via ORAL
  Filled 2015-06-11: qty 2

## 2015-06-11 MED ORDER — POTASSIUM CHLORIDE 10 MEQ/100ML IV SOLN
10.0000 meq | INTRAVENOUS | Status: AC
  Administered 2015-06-11 (×2): 10 meq via INTRAVENOUS
  Filled 2015-06-11 (×2): qty 100

## 2015-06-11 MED ORDER — SODIUM CHLORIDE 0.9 % IV BOLUS (SEPSIS)
1000.0000 mL | Freq: Once | INTRAVENOUS | Status: AC
  Administered 2015-06-11: 1000 mL via INTRAVENOUS

## 2015-06-11 MED ORDER — ONDANSETRON HCL 4 MG/2ML IJ SOLN
4.0000 mg | Freq: Once | INTRAMUSCULAR | Status: AC
  Administered 2015-06-11: 4 mg via INTRAVENOUS
  Filled 2015-06-11: qty 2

## 2015-06-11 MED ORDER — SODIUM CHLORIDE 0.9 % IV BOLUS (SEPSIS)
1000.0000 mL | Freq: Once | INTRAVENOUS | Status: AC
Start: 2015-06-11 — End: 2015-06-11
  Administered 2015-06-11: 1000 mL via INTRAVENOUS

## 2015-06-11 MED ORDER — METRONIDAZOLE 500 MG PO TABS
2000.0000 mg | ORAL_TABLET | Freq: Once | ORAL | Status: AC
  Administered 2015-06-11: 2000 mg via ORAL
  Filled 2015-06-11: qty 4

## 2015-06-11 MED ORDER — LORAZEPAM 2 MG/ML IJ SOLN
1.0000 mg | Freq: Once | INTRAMUSCULAR | Status: AC
  Administered 2015-06-11: 1 mg via INTRAVENOUS
  Filled 2015-06-11: qty 1

## 2015-06-11 MED ORDER — DEXTROSE 5 % IV SOLN
1.0000 g | INTRAVENOUS | Status: DC
  Administered 2015-06-11 (×2): 1 g via INTRAVENOUS
  Filled 2015-06-11 (×4): qty 10

## 2015-06-11 MED ORDER — GI COCKTAIL ~~LOC~~
30.0000 mL | Freq: Once | ORAL | Status: AC
  Administered 2015-06-11: 30 mL via ORAL
  Filled 2015-06-11: qty 30

## 2015-06-11 MED ORDER — PROCHLORPERAZINE EDISYLATE 5 MG/ML IJ SOLN
10.0000 mg | Freq: Once | INTRAMUSCULAR | Status: AC
  Administered 2015-06-11: 10 mg via INTRAVENOUS
  Filled 2015-06-11: qty 2

## 2015-06-11 MED ORDER — HYDROMORPHONE HCL 1 MG/ML IJ SOLN
1.0000 mg | Freq: Once | INTRAMUSCULAR | Status: AC
  Administered 2015-06-11: 1 mg via INTRAVENOUS
  Filled 2015-06-11: qty 1

## 2015-06-11 NOTE — ED Notes (Signed)
Pt reports vomiting and nausea has resumed

## 2015-06-11 NOTE — ED Notes (Signed)
Pt requesting something for reflux. Requesting GI cocktail per Dr Juleen ChinaKohut may have a dose

## 2015-06-11 NOTE — ED Notes (Signed)
Pt brought in by ems, states she has had nausea and vomiting for a week.  Given Zofran 4mg  IVP by ems which pt stated did not work and must have Phenergan.  EMS states pt has not vomited.

## 2015-06-11 NOTE — ED Provider Notes (Signed)
CSN: [id number]     Arrival date & time 06/11/15  1233 History   First MD Initiated Contact with Patient 06/11/15 1317     Chief Complaint  Patient presents with  . Emesis     (Consider location/radiation/quality/duration/timing/severity/associated sxs/prior Treatment) HPI   50yF with n/v and abdominal pain. Onset About a week ago. Persistent and worsening since then. Patient was given 4 mg of Zofran by EMS but feels like it has not helped. Abdominal pain is diffuse. Crampy in nature. No appreciable exacerbating relieving factors. She is in stool. No urinary complaints. No fevers or chills. No sick contacts.  Past Medical History  Diagnosis Date  . Acid reflux   . Depression   . Bipolar 1 disorder [facility])    Past Surgical History  Procedure Laterality Date  . Cystectomy    . Appendectomy    . Ovarian cyst surgery    . Oophorectomy     No family history on file. Social History  Substance Use Topics  . Smoking status: Current Every Day Smoker -- 0.50 packs/day    Types: Cigarettes  . Smokeless tobacco: Not on file  . Alcohol Use: Yes   OB History    No data available     Review of Systems  All systems reviewed and negative, other than as noted in HPI.   Allergies  Morphine  Home Medications   Prior to Admission medications   Medication Sig Start Date End Date Taking? Authorizing Provider  citalopram (CELEXA) 10 MG tablet Take 10 mg by mouth daily.   Yes Historical Provider, MD  diphenhydrAMINE (BENADRYL) 25 mg capsule Take 25 mg by mouth every 6 (six) hours as needed. For itching   Yes Historical Provider, MD  ibuprofen (ADVIL,MOTRIN) 200 MG tablet Take 200 mg by mouth every 6 (six) hours as needed. For pain   Yes Historical Provider, MD  lamoTRIgine (LAMICTAL) 200 MG tablet Take 200 mg by mouth daily.   Yes Historical Provider, MD  QUEtiapine (SEROQUEL) 100 MG tablet Take 100 mg by mouth 3 (three) times daily.    Yes Historical Provider, MD  ranitidine (ZANTAC) 75  MG tablet Take 75 mg by mouth 2 (two) times daily.   Yes Historical Provider, MD  traZODone (DESYREL) 150 MG tablet Take 150 mg by mouth at bedtime.   Yes Historical Provider, MD   BP 134/68 mmHg  Pulse 77  Temp(Src) 98.9 F (37.2 C) (Oral)  Resp 16  Ht  (1.778 m)  Wt 180 lb (81.647 kg)  BMI 25.83 kg/m2  SpO2 100% Physical Exam  Constitutional: She appears well-developed and well-nourished. No distress.  HENT:  Head: Normocephalic and atraumatic.  Eyes: Conjunctivae are normal. Right eye exhibits no discharge. Left eye exhibits no discharge.  Neck: Neck supple.  Cardiovascular: Normal rate, regular rhythm and normal heart sounds.  Exam reveals no gallop and no friction rub.   No murmur heard. Pulmonary/Chest: Effort normal and breath sounds normal. No respiratory distress.  Abdominal: Soft. She exhibits no distension. There is tenderness.  Mild diffuse abdominal pain. No distension.   Genitourinary:  No cva tenderness  Musculoskeletal: She exhibits no edema or tenderness.  Neurological: She is alert.  Skin: Skin is warm and dry.  Psychiatric: She has a normal mood and affect. Her behavior is normal. Thought content normal.  Nursing note and vitals reviewed.   ED Course  Procedures (including critical care time) Labs Review Labs Reviewed  COMPREHENSIVE METABOLIC PANEL - Abnormal; Notable for  the following:    Potassium 2.9 (*)    Chloride 100 (*)    Glucose, Bld 125 (*)    AST 62 (*)    ALT 136 (*)    All other components within normal limits  CBC - Abnormal; Notable for the following:    WBC 15.8 (*)    RBC 5.72 (*)    Hemoglobin 16.6 (*)    HCT 49.9 (*)    All other components within normal limits  URINALYSIS, ROUTINE W REFLEX MICROSCOPIC (NOT AT [facility]) - Abnormal; Notable for the following:    APPearance HAZY (*)    Hgb urine dipstick MODERATE (*)    Protein, ur 30 (*)    Leukocytes, UA MODERATE (*)    All other components within normal limits  URINE  MICROSCOPIC-ADD ON - Abnormal; Notable for the following:    Squamous Epithelial / LPF 6-30 (*)    Bacteria, UA MANY (*)    All other components within normal limits  URINE CULTURE  LIPASE, BLOOD    Imaging Review No results found. I have personally reviewed and evaluated these images and lab results as part of my medical decision-making.   EKG Interpretation None      MDM   Final diagnoses:  Intractable vomiting with nausea, vomiting of unspecified type  UTI (lower urinary tract infection)  Trichomonas infection    49yF with diffuse abdominal pain and n/v. Difficulty controlling symptoms. Will admit. Incidentally noted UTI and trichomonas. Will admit.    Raeford RazorStephen Jamala Kohen, MD 06/21/15 [phone]

## 2015-06-11 NOTE — ED Notes (Signed)
Pt ambulated to BR  Unable to void

## 2015-06-11 NOTE — H&P (Signed)
History and Physical    [name] ZOX:[id number] DOB: Feb 26, 1965 DOA: 06/11/2015  Referring MD/NP/PA: Houston Siren, PCP: No PCP Per Patient Outpatient Specialists: none Patient coming from: home  Chief Complaint: nausea and vomiting  HPI: [name] is a 50 y.o. female with medical history significant of GERD, Depression, and bipolar 1 disorder presents via EMS with complaints of vomiting and epigastric pain onset 5 days ago. She reports feeling cold as well. She had an appointment to see a doctor today, but was unable to drive herself there. She denies any fever.  She had no dysuria, and had no flank pain.  She had no ill contact.    ED Course: While in the ED, patient was found to have a potassium of 2.9. Her lipase was normal, though AST and ALT were elevated. Her WBC was found to be 15.8. UA was indicative of infection for UTI, along with trichomonas found.  Urine was collected for culture. Hospitalist was asked to admit her for intractable nausea and vomiting.   Review of Systems: As per HPI otherwise 10 point review of systems negative.    Past Medical History  Diagnosis Date  . Acid reflux   . Depression   . Bipolar 1 disorder [facility])     Past Surgical History  Procedure Laterality Date  . Cystectomy    . Appendectomy    . Ovarian cyst surgery    . Oophorectomy       reports that she has been smoking Cigarettes.  She has been smoking about 0.50 packs per day. She does not have any smokeless tobacco history on file. She reports that she drinks alcohol. She reports that she does not use illicit drugs.  Allergies  Allergen Reactions  . Morphine Itching    No family history on file.   Prior to Admission medications   Medication Sig Start Date End Date Taking? Authorizing Provider  citalopram (CELEXA) 10 MG tablet Take 10 mg by mouth daily.   Yes Historical Provider, MD  diphenhydrAMINE (BENADRYL) 25 mg capsule Take 25 mg by mouth every 6 (six) hours as needed.  For itching   Yes Historical Provider, MD  ibuprofen (ADVIL,MOTRIN) 200 MG tablet Take 200 mg by mouth every 6 (six) hours as needed. For pain   Yes Historical Provider, MD  lamoTRIgine (LAMICTAL) 200 MG tablet Take 200 mg by mouth daily.   Yes Historical Provider, MD  QUEtiapine (SEROQUEL) 100 MG tablet Take 100 mg by mouth 3 (three) times daily.    Yes Historical Provider, MD  ranitidine (ZANTAC) 75 MG tablet Take 75 mg by mouth 2 (two) times daily.   Yes Historical Provider, MD  traZODone (DESYREL) 150 MG tablet Take 150 mg by mouth at bedtime.   Yes Historical Provider, MD    Physical Exam: Filed Vitals:   06/11/15 1830 06/11/15 1900 06/11/15 1930 06/11/15 2018  BP: 120/85 127/96 113/88 134/68  Pulse:    77  Temp:      TempSrc:      Resp: Height:      Weight:      SpO2:    100%      Constitutional: NAD, calm, comfortable Filed Vitals:   06/11/15 1830 06/11/15 1900 06/11/15 1930 06/11/15 2018  BP: 120/85 127/96 113/88 134/68  Pulse:    77  Temp:      TempSrc:      Resp: Height:  Weight:      SpO2:    100%   Eyes: PERRL, lids and conjunctivae normal ENMT: Mucous membranes are moist. Posterior pharynx clear of any exudate or lesions.Normal dentition.  Neck: normal, supple, no masses, no thyromegaly Respiratory: clear to auscultation bilaterally, no wheezing, no crackles. Normal respiratory effort. No accessory muscle use.  Cardiovascular: Regular rate and rhythm, no murmurs / rubs / gallops. No extremity edema. 2+ pedal pulses. No carotid bruits.  Abdomen: no tenderness, no masses palpated. No hepatosplenomegaly. Bowel sounds positive. No CVA tenderness Musculoskeletal: no clubbing / cyanosis. No joint deformity upper and lower extremities. Good ROM, no contractures. Normal muscle tone.  Skin: no rashes, lesions, ulcers. No induration Neurologic: CN 2-12 grossly intact. Sensation intact, DTR normal. Strength 5/5 in all 4.  Psychiatric: Normal  judgment and insight. Alert and oriented x 3. Normal mood.   Labs on Admission: I have personally reviewed following labs and imaging studies  CBC:  Recent Labs Lab 06/11/15 1253  WBC 15.8*  HGB 16.6*  HCT 49.9*  MCV 87.2  PLT 251   Basic Metabolic Panel:  Recent Labs Lab 06/11/15 1253  NA 142  K 2.9*  CL 100*  CO2 27  GLUCOSE 125*  BUN 16  CREATININE 0.86  CALCIUM 9.9   GFR: Estimated Creatinine Clearance: 85.6 mL/min (by C-G formula based on Cr of 0.86). Liver Function Tests:  Recent Labs Lab 06/11/15 1253  AST 62*  ALT 136*  ALKPHOS 67  BILITOT 0.8  PROT 8.0  ALBUMIN 4.7    Recent Labs Lab 06/11/15 1253  LIPASE 28   Urine analysis:    Component Value Date/Time   COLORURINE YELLOW 06/11/2015 2101   APPEARANCEUR HAZY* 06/11/2015 2101   LABSPEC 1.025 06/11/2015 2101   PHURINE 6.0 06/11/2015 2101   GLUCOSEU NEGATIVE 06/11/2015 2101   HGBUR MODERATE* 06/11/2015 2101   BILIRUBINUR NEGATIVE 06/11/2015 2101   KETONESUR NEGATIVE 06/11/2015 2101   PROTEINUR 30* 06/11/2015 2101   UROBILINOGEN 0.2 09/04/2008 0014   NITRITE NEGATIVE 06/11/2015 2101   LEUKOCYTESUR MODERATE* 06/11/2015 2101    Assessment/Plan Principal Problem:   Pyelonephritis Active Problems:   Nausea and vomiting   Hypokalemia   Trichomonas vaginitis   1. Pyelonephritis. No flank pain, but is experiencing nausea and vomiting. Lipase is okay.  Afebrile. UA indiciative of infection. WBC 15.8. BUN and creatinine wnl. Will give Rocephin, antiemetics, and fluids. Will order urine culture.  I will get pregnancy test to be sure as well.  2. Trichomoniasis vaginitis. Will treat with PO Flagyl. We discussed about STD.  3. Hypokalemia. 2.9. Will replace. 4. GERD. Will give PPI  DVT prophylaxis: Lovenox Code Status: Full Family Communication: No family present at bedside. Disposition Plan: Admit to inpatient. Discharge home in 3-5 days Consults called: none Admission status:  inpatient   Houston SirenPeter Rhya Shan, MD FACP.  [facility]   If 7PM-7AM, please contact night-coverage [email or web address] Password [email or web address]  06/11/2015, 10:22 PM   By signing my name below, I, Adron BeneGreylon Gawaluck, attest that this documentation has been prepared under the direction and in the presence of Houston SirenPeter Anah Billard, MD. Electronically Signed: Adron BeneGreylon Gawaluck, Scribe 06/11/2015 10:20pm

## 2015-06-12 DIAGNOSIS — N12 Tubulo-interstitial nephritis, not specified as acute or chronic: Principal | ICD-10-CM

## 2015-06-12 DIAGNOSIS — A5901 Trichomonal vulvovaginitis: Secondary | ICD-10-CM

## 2015-06-12 DIAGNOSIS — R112 Nausea with vomiting, unspecified: Secondary | ICD-10-CM

## 2015-06-12 LAB — BASIC METABOLIC PANEL WITH GFR
Anion gap: 8 (ref 5–15)
BUN: 13 mg/dL (ref 6–20)
CO2: 26 mmol/L (ref 22–32)
Calcium: 8.2 mg/dL — ABNORMAL LOW (ref 8.9–10.3)
Chloride: 107 mmol/L (ref 101–111)
Creatinine, Ser: 0.87 mg/dL (ref 0.44–1.00)
GFR calc Af Amer: >60 mL/min
GFR calc non Af Amer: >60 mL/min
Glucose, Bld: 110 mg/dL — ABNORMAL HIGH (ref 65–99)
Potassium: 3.9 mmol/L (ref 3.5–5.1)
Sodium: 141 mmol/L (ref 135–145)

## 2015-06-12 LAB — CBC
HCT: 41.8 % (ref 36.0–46.0)
Hemoglobin: 13.7 g/dL (ref 12.0–15.0)
MCH: 29.5 pg (ref 26.0–34.0)
MCHC: 32.8 g/dL (ref 30.0–36.0)
MCV: 90.1 fL (ref 78.0–100.0)
Platelets: 248 10*3/uL (ref 150–400)
RBC: 4.64 MIL/uL (ref 3.87–5.11)
RDW: 12.9 % (ref 11.5–15.5)
WBC: 9.9 10*3/uL (ref 4.0–10.5)

## 2015-06-12 LAB — RAPID URINE DRUG SCREEN, HOSP PERFORMED
AMPHETAMINES: NOT DETECTED
Barbiturates: NOT DETECTED
Benzodiazepines: NOT DETECTED
COCAINE: POSITIVE — AB
OPIATES: NOT DETECTED
Tetrahydrocannabinol: NOT DETECTED

## 2015-06-12 LAB — TSH: TSH: 1.035 u[IU]/mL (ref 0.350–4.500)

## 2015-06-12 LAB — PREGNANCY, URINE: PREG TEST UR: NEGATIVE

## 2015-06-12 LAB — MRSA PCR SCREENING: MRSA by PCR: NEGATIVE

## 2015-06-12 MED ORDER — KCL IN DEXTROSE-NACL 40-5-0.9 MEQ/L-%-% IV SOLN
INTRAVENOUS | Status: DC
  Administered 2015-06-12 (×3): via INTRAVENOUS
  Filled 2015-06-12 (×11): qty 1000

## 2015-06-12 MED ORDER — ENOXAPARIN SODIUM 40 MG/0.4ML ~~LOC~~ SOLN
40.0000 mg | SUBCUTANEOUS | Status: DC
  Administered 2015-06-12 – 2015-06-13 (×2): 40 mg via SUBCUTANEOUS
  Filled 2015-06-12 (×2): qty 0.4

## 2015-06-12 MED ORDER — SODIUM CHLORIDE 0.9% FLUSH
3.0000 mL | Freq: Two times a day (BID) | INTRAVENOUS | Status: DC
  Administered 2015-06-12 – 2015-06-13 (×2): 3 mL via INTRAVENOUS

## 2015-06-12 MED ORDER — METRONIDAZOLE 500 MG PO TABS
500.0000 mg | ORAL_TABLET | Freq: Three times a day (TID) | ORAL | Status: DC
  Administered 2015-06-12 – 2015-06-13 (×5): 500 mg via ORAL
  Filled 2015-06-12 (×5): qty 1

## 2015-06-12 MED ORDER — QUETIAPINE FUMARATE 100 MG PO TABS
100.0000 mg | ORAL_TABLET | Freq: Three times a day (TID) | ORAL | Status: DC
Start: 2015-06-12 — End: 2015-06-13
  Administered 2015-06-12 – 2015-06-13 (×4): 100 mg via ORAL
  Filled 2015-06-12 (×13): qty 1

## 2015-06-12 MED ORDER — SENNA 8.6 MG PO TABS
1.0000 | ORAL_TABLET | Freq: Two times a day (BID) | ORAL | Status: DC
  Administered 2015-06-12 – 2015-06-13 (×3): 8.6 mg via ORAL
  Filled 2015-06-12 (×3): qty 1

## 2015-06-12 MED ORDER — LAMOTRIGINE 100 MG PO TABS
200.0000 mg | ORAL_TABLET | Freq: Every day | ORAL | Status: DC
  Administered 2015-06-12 – 2015-06-13 (×2): 200 mg via ORAL
  Filled 2015-06-12: qty 1
  Filled 2015-06-12 (×4): qty 2

## 2015-06-12 MED ORDER — KCL IN DEXTROSE-NACL 40-5-0.9 MEQ/L-%-% IV SOLN
INTRAVENOUS | Status: AC
  Filled 2015-06-12: qty 2000

## 2015-06-12 MED ORDER — POTASSIUM CHLORIDE 10 MEQ/100ML IV SOLN
10.0000 meq | INTRAVENOUS | Status: AC
  Administered 2015-06-12 (×3): 10 meq via INTRAVENOUS
  Filled 2015-06-12: qty 100

## 2015-06-12 MED ORDER — HYDROMORPHONE HCL 1 MG/ML IJ SOLN
0.5000 mg | INTRAMUSCULAR | Status: DC | PRN
  Administered 2015-06-12 – 2015-06-13 (×8): 0.5 mg via INTRAVENOUS
  Filled 2015-06-12 (×8): qty 1

## 2015-06-12 MED ORDER — TRAZODONE HCL 50 MG PO TABS
150.0000 mg | ORAL_TABLET | Freq: Every day | ORAL | Status: DC
Start: 2015-06-12 — End: 2015-06-13
  Administered 2015-06-12: 150 mg via ORAL
  Filled 2015-06-12 (×2): qty 3

## 2015-06-12 MED ORDER — FAMOTIDINE 20 MG PO TABS
20.0000 mg | ORAL_TABLET | Freq: Every day | ORAL | Status: DC
  Administered 2015-06-12 – 2015-06-13 (×2): 20 mg via ORAL
  Filled 2015-06-12 (×2): qty 1

## 2015-06-12 MED ORDER — DIPHENHYDRAMINE HCL 25 MG PO CAPS
25.0000 mg | ORAL_CAPSULE | Freq: Four times a day (QID) | ORAL | Status: DC | PRN
  Administered 2015-06-12 – 2015-06-13 (×2): 25 mg via ORAL
  Filled 2015-06-12 (×2): qty 1

## 2015-06-12 MED ORDER — ACETAMINOPHEN 650 MG RE SUPP: 650.0000 mg | Freq: Four times a day (QID) | RECTAL | Status: DC | PRN

## 2015-06-12 MED ORDER — ONDANSETRON HCL 4 MG PO TABS: 4.0000 mg | ORAL_TABLET | Freq: Four times a day (QID) | ORAL | Status: DC | PRN

## 2015-06-12 MED ORDER — ONDANSETRON HCL 4 MG/2ML IJ SOLN
4.0000 mg | Freq: Four times a day (QID) | INTRAMUSCULAR | Status: DC | PRN
  Administered 2015-06-12 – 2015-06-13 (×4): 4 mg via INTRAVENOUS
  Filled 2015-06-12 (×4): qty 2

## 2015-06-12 MED ORDER — CEFTRIAXONE SODIUM 1 G IJ SOLR
1.0000 g | INTRAMUSCULAR | Status: DC
  Administered 2015-06-12: 1 g via INTRAVENOUS
  Filled 2015-06-12 (×3): qty 10

## 2015-06-12 MED ORDER — ACETAMINOPHEN 325 MG PO TABS
650.0000 mg | ORAL_TABLET | Freq: Four times a day (QID) | ORAL | Status: DC | PRN
  Administered 2015-06-13: 650 mg via ORAL
  Filled 2015-06-12: qty 2

## 2015-06-12 MED ORDER — CITALOPRAM HYDROBROMIDE 20 MG PO TABS
10.0000 mg | ORAL_TABLET | Freq: Every day | ORAL | Status: DC
  Administered 2015-06-12 – 2015-06-13 (×2): 10 mg via ORAL
  Filled 2015-06-12 (×2): qty 1

## 2015-06-12 NOTE — Progress Notes (Signed)
Pt. Arrived to unit rm# [id number] in stable condition. Report received from Lutricia HorsfallKara Davis.

## 2015-06-12 NOTE — Care Management (Signed)
CM attempted to see pt, pt sleeping and unable to wake pt with verbal stimuli. Pt uninsured and per MD notes has no PCP. Pt may f/u with [facility] de[facility] and weekend [facility] pt DC'd on abx not on [employer]'s $4 list.

## 2015-06-12 NOTE — Progress Notes (Signed)
PROGRESS NOTE    Christina CoombeJamie P [name]  ZOX:[id number] DOB: 03/28/1965 DOA: 06/11/2015 PCP: No PCP Per Patient  Outpatient Specialists:     Brief Narrative:  [age] y.o. female with medical history significant of GERD, Depression, and bipolar 1 disorder presents via EMS with complaints of vomiting and epigastric pain onset 5 days ago. She reports feeling cold as well. She had an appointment to see a doctor today, but was unable to drive herself there. She denies any fever. She had no dysuria, and had no flank pain. She had no ill contact   Assessment & Plan:   Principal Problem:   Pyelonephritis Active Problems:   Nausea and vomiting   Hypokalemia   Trichomonas vaginitis  1. Pyelonephritis.  1. No flank pain 2. Patient does complain of abd pain and nausea and vomiting 3. Lipase is normal. Pt afebrile.  4. UA suggestive of UTI 5. WBC 15.8.  6. Pt continued on Rocephin, antiemetics, and fluids.  7. Urine pregnancy test neg 8. Follow cultures 2. Trichomoniasis vaginitis. 1. Cont PO Flagyl.  3. Hypokalemia.  1. Replaced 4. GERD.  1. Continue PPI  DVT prophylaxis: Lovenox Code Status: Full Family Communication: Pt in room, family not at bedside Disposition Plan: Possible d/c home in 24-48hrs   Consultants:     Procedures:     Antimicrobials:  Rocephin 4/27>>>  Flagyl 4/27>>>    Subjective: Still complains of abd pain and nausea  Objective: Filed Vitals:   06/12/15 0700 06/12/15 0800 06/12/15 1200 06/12/15 1432  BP: 95/56 125/75 109/96 133/82  Pulse: 69 76 77 85  Temp:  98.3 F (36.8 C)  98.6 F (37 C)  TempSrc:  Oral  Oral  Resp: 16 17 15 16   Height:      Weight:      SpO2: 97% 100% 97% 99%    Intake/Output Summary (Last 24 hours) at 06/12/15 1532 Last data filed at 06/12/15 1433  Gross per 24 hour  Intake 1473.74 ml  Output    400 ml  Net 1073.74 ml   Filed Weights   06/11/15 1238 06/12/15 0046  Weight: 81.647 kg (180 lb) 71.2 kg (156 lb  15.5 oz)    Examination:  General exam: Appears calm and comfortable  Respiratory system: Clear to auscultation. Respiratory effort normal. Cardiovascular system: S1 & S2 heard, RRR.  No pedal edema. Gastrointestinal system: Generally tender, pos BS, no masses Central nervous system: Alert and oriented. No focal neurological deficits. Extremities: Symmetric 5 x 5 power. Skin: No rashes, lesions  Psychiatry: Judgement and insight appear normal. Mood & affect appropriate.     Data Reviewed: I have personally reviewed following labs and imaging studies  CBC:  Recent Labs Lab 06/11/15 1253 06/12/15 0355  WBC 15.8* 9.9  HGB 16.6* 13.7  HCT 49.9* 41.8  MCV 87.2 90.1  PLT 251 248   Basic Metabolic Panel:  Recent Labs Lab 06/11/15 1253 06/12/15 0355  NA 142 141  K 2.9* 3.9  CL 100* 107  CO2 27 26  GLUCOSE 125* 110*  BUN 16 13  CREATININE 0.86 0.87  CALCIUM 9.9 8.2*   GFR: Estimated Creatinine Clearance: 84.6 mL/min (by C-G formula based on Cr of 0.87). Liver Function Tests:  Recent Labs Lab 06/11/15 1253  AST 62*  ALT 136*  ALKPHOS 67  BILITOT 0.8  PROT 8.0  ALBUMIN 4.7    Recent Labs Lab 06/11/15 1253  LIPASE 28   No results for input(s): AMMONIA in the last 168  hours. Coagulation Profile: No results for input(s): INR, PROTIME in the last 168 hours. Cardiac Enzymes: No results for input(s): CKTOTAL, CKMB, CKMBINDEX, TROPONINI in the last 168 hours. BNP (last 3 results) No results for input(s): PROBNP in the last 8760 hours. HbA1C: No results for input(s): HGBA1C in the last 72 hours. CBG: No results for input(s): GLUCAP in the last 168 hours. Lipid Profile: No results for input(s): CHOL, HDL, LDLCALC, TRIG, CHOLHDL, LDLDIRECT in the last 72 hours. Thyroid Function Tests:  Recent Labs  06/12/15 0030  TSH 1.035   Anemia Panel: No results for input(s): VITAMINB12, FOLATE, FERRITIN, TIBC, IRON, RETICCTPCT in the last 72 hours. Urine  analysis:    Component Value Date/Time   COLORURINE YELLOW 06/11/2015 2101   APPEARANCEUR HAZY* 06/11/2015 2101   LABSPEC 1.025 06/11/2015 2101   PHURINE 6.0 06/11/2015 2101   GLUCOSEU NEGATIVE 06/11/2015 2101   HGBUR MODERATE* 06/11/2015 2101   BILIRUBINUR NEGATIVE 06/11/2015 2101   KETONESUR NEGATIVE 06/11/2015 2101   PROTEINUR 30* 06/11/2015 2101   UROBILINOGEN 0.2 09/04/2008 0014   NITRITE NEGATIVE 06/11/2015 2101   LEUKOCYTESUR MODERATE* 06/11/2015 2101   Sepsis Labs: (procalcitonin:4,lacticidven:4)  ) Recent Results (from the past 240 hour(s))  MRSA PCR Screening     Status: None   Collection Time: 06/12/15 12:30 AM  Result Value Ref Range Status   MRSA by PCR NEGATIVE NEGATIVE Final    Comment:        The GeneXpert MRSA Assay (FDA approved for NASAL specimens only), is one component of a comprehensive MRSA colonization surveillance program. It is not intended to diagnose MRSA infection nor to guide or monitor treatment for MRSA infections.          Radiology Studies: No results found.      Scheduled Meds: . cefTRIAXone (ROCEPHIN)  IV  1 g Intravenous Q24H  . cefTRIAXone (ROCEPHIN)  IV  1 g Intravenous Q24H  . citalopram  10 mg Oral Daily  . enoxaparin (LOVENOX) injection  40 mg Subcutaneous Q24H  . famotidine  20 mg Oral Daily  . lamoTRIgine  200 mg Oral Daily  . metroNIDAZOLE  500 mg Oral Q8H  . QUEtiapine  100 mg Oral TID  . senna  1 tablet Oral BID  . sodium chloride flush  3 mL Intravenous Q12H  . traZODone  150 mg Oral QHS   Continuous Infusions: . dextrose 5 % and 0.9 % NaCl with KCl 40 mEq/L 125 mL/hr at 06/12/15 0810     LOS: 0 days    Liela Rylee, Scheryl Marten, MD [facility] Pager [phone]  If 7PM-7AM, please contact night-coverage [email or web address] Password [facility] 06/12/2015, 3:32 PM

## 2015-06-12 NOTE — Progress Notes (Signed)
Patient okay for transfer to medical surgical unit per MD order. AO x3, O2 sat maintained 98-100% on RA, IVF infusing as ordered. Left unit in wheelchair in stable condition.

## 2015-06-13 DIAGNOSIS — E876 Hypokalemia: Secondary | ICD-10-CM

## 2015-06-13 DIAGNOSIS — F1410 Cocaine abuse, uncomplicated: Secondary | ICD-10-CM

## 2015-06-13 LAB — URINE CULTURE

## 2015-06-13 LAB — CREATININE, SERUM

## 2015-06-13 MED ORDER — CEFUROXIME AXETIL 250 MG PO TABS: 250.0000 mg | ORAL_TABLET | Freq: Two times a day (BID) | ORAL | Status: AC

## 2015-06-13 MED ORDER — METRONIDAZOLE 500 MG PO TABS: 500.0000 mg | ORAL_TABLET | Freq: Three times a day (TID) | ORAL | Status: AC

## 2015-06-13 NOTE — Discharge Summary (Signed)
Physician Discharge Summary  [name]:[id number] DOB: 1965-09-02 DOA: 06/11/2015  PCP: No PCP Per Patient  Admit date: 06/11/2015 Discharge date: 06/13/2015  Time spent: 20 minutes  Recommendations for Outpatient Follow-up:  1. Pt to follow up with PCP in 2-3 weeks   Discharge Diagnoses:  Principal Problem:   Pyelonephritis Active Problems:   Nausea and vomiting   Hypokalemia   Trichomonas vaginitis   Cocaine abuse   Discharge Condition: Stable  Diet recommendation: Regular  Filed Weights   06/11/15 1238 06/12/15 0046  Weight: 81.647 kg (180 lb) 71.2 kg (156 lb 15.5 oz)    History of present illness:  Please review dictated H and P from 4/27 for details. Briefly, 50 y.o. female with medical history significant of GERD, Depression, and bipolar 1 disorder presents via EMS with complaints of vomiting and epigastric pain onset 5 days ago. She reports feeling cold as well. She had an appointment to see a doctor today, but was unable to drive herself there. She denies any fever. She had no dysuria, and had no flank pain. She had no ill contact  Hospital Course:  1. Pyelonephritis.  1. No flank pain 2. Patient does complain of abd pain and nausea and vomiting 3. Lipase is normal. Pt afebrile.  4. UA suggestive of UTI 5. WBC 15.8, normalized overnight 6. Pt was initially continued on Rocephin, antiemetics, and fluids.  7. Urine pregnancy test neg 8. As pt responded to rocephin, pt to complete course with ceftin on discharge 2. Trichomoniasis vaginitis. 1. Cont PO Flagyl for total of one week 2. Pt has notified sexual partners to be tested/treated 3. Hypokalemia.  1. Replaced 4. GERD.  1. Continue PPI 5. Cocaine abuse 1. Initially claimed to not do illicit substances 2. Pt tested cocaine positive on current urine drug screen as well as 2009 drug screen 3. No claims to have last done cocaine one week prior to admission 4. Cessation done  Discharge  Exam: Filed Vitals:   06/12/15 0800 06/12/15 1200 06/12/15 1432 06/12/15 2146  BP: 125/75 109/96 133/82 94/51  Pulse: 76 77 85 80  Temp: 98.3 F (36.8 C)  98.6 F (37 C) 98.4 F (36.9 C)  TempSrc: Oral  Oral Oral  Resp: Height:      Weight:      SpO2: 100% 97% 99% 100%    General: Awake, in nad Cardiovascular: regular, s1, s2 Respiratory: normal resp effort, no wheezing  Discharge Instructions     Medication List    TAKE these medications        cefUROXime 250 MG tablet  Commonly known as:  CEFTIN  Take 1 tablet (250 mg total) by mouth 2 (two) times daily with a meal.     citalopram 10 MG tablet  Commonly known as:  CELEXA  Take 10 mg by mouth daily.     diphenhydrAMINE 25 mg capsule  Commonly known as:  BENADRYL  Take 25 mg by mouth every 6 (six) hours as needed. For itching     ibuprofen 200 MG tablet  Commonly known as:  ADVIL,MOTRIN  Take 200 mg by mouth every 6 (six) hours as needed. For pain     lamoTRIgine 200 MG tablet  Commonly known as:  LAMICTAL  Take 200 mg by mouth daily.     metroNIDAZOLE 500 MG tablet  Commonly known as:  FLAGYL  Take 1 tablet (500 mg total) by mouth every 8 (eight) hours.  QUEtiapine 100 MG tablet  Commonly known as:  SEROQUEL  Take 100 mg by mouth 3 (three) times daily.     ranitidine 75 MG tablet  Commonly known as:  ZANTAC  Take 75 mg by mouth 2 (two) times daily.     traZODone 150 MG tablet  Commonly known as:  DESYREL  Take 150 mg by mouth at bedtime.       Allergies  Allergen Reactions  . Morphine Itching   Follow-up Information    Follow up with Follow up with PCP in 2-3 weeks.   Why:  Hospital follow up       The results of significant diagnostics from this hospitalization (including imaging, microbiology, ancillary and laboratory) are listed below for reference.    Significant Diagnostic Studies: No results found.  Microbiology: Recent Results (from the past 240 hour(s))   Urine culture     Status: Abnormal   Collection Time: 06/11/15  9:01 PM  Result Value Ref Range Status   Specimen Description URINE, CLEAN CATCH  Final   Special Requests NONE  Final   Culture MULTIPLE SPECIES PRESENT, SUGGEST RECOLLECTION (A)  Final   Report Status 06/13/2015 FINAL  Final  MRSA PCR Screening     Status: None   Collection Time: 06/12/15 12:30 AM  Result Value Ref Range Status   MRSA by PCR NEGATIVE NEGATIVE Final    Comment:        The GeneXpert MRSA Assay (FDA approved for NASAL specimens only), is one component of a comprehensive MRSA colonization surveillance program. It is not intended to diagnose MRSA infection nor to guide or monitor treatment for MRSA infections.      Labs: Basic Metabolic Panel:  Recent Labs Lab 06/11/15 1253 06/12/15 0355  NA 142 141  K 2.9* 3.9  CL 100* 107  CO2 27 26  GLUCOSE 125* 110*  BUN 16 13  CREATININE 0.86 0.87  CALCIUM 9.9 8.2*   Liver Function Tests:  Recent Labs Lab 06/11/15 1253  AST 62*  ALT 136*  ALKPHOS 67  BILITOT 0.8  PROT 8.0  ALBUMIN 4.7    Recent Labs Lab 06/11/15 1253  LIPASE 28   No results for input(s): AMMONIA in the last 168 hours. CBC:  Recent Labs Lab 06/11/15 1253 06/12/15 0355  WBC 15.8* 9.9  HGB 16.6* 13.7  HCT 49.9* 41.8  MCV 87.2 90.1  PLT 251 248   Cardiac Enzymes: No results for input(s): CKTOTAL, CKMB, CKMBINDEX, TROPONINI in the last 168 hours. BNP: BNP (last 3 results) No results for input(s): BNP in the last 8760 hours.  ProBNP (last 3 results) No results for input(s): PROBNP in the last 8760 hours.  CBG: No results for input(s): GLUCAP in the last 168 hours.   Signed:  Kayle Passarelli, Scheryl MartenSTEPHEN K  [facility] 06/13/2015, 5:06 PM

## 2015-06-13 NOTE — Progress Notes (Signed)
Patient alert and oriented, independent, VSS, pt. Tolerating diet well. No complaints of pain or nausea. Pt. Had IV removed tip intact. Pt. Had prescriptions given. Pt. Voiced understanding of discharge instructions with no further questions. Pt. Discharged via wheelchair with auxilliary.  

## 2017-05-13 LAB — GLUCOSE, POCT (MANUAL RESULT ENTRY): POC Glucose: 130 mg/dl — AB (ref 70–99)

## 2017-05-16 NOTE — Congregational Nurse Program (Signed)
Congregational Nurse Program Note  Date of Encounter: 05/16/2017  Past Medical History: Past Medical History:  Diagnosis Date  . Acid reflux   . Bipolar 1 disorder (HCC)   . Depression     Encounter Details: CNP Questionnaire - 05/09/17 1800      Questionnaire   Patient Status  Not Applicable    Race  White or Caucasian    Location Patient Served At  [place] [facility] [employer] [place]    Insurance  Not Applicable    Uninsured  Uninsured (NEW 1x/quarter)    Food  No food insecurities    Housing/Utilities  No permanent housing    Transportation  No transportation needs    Interpersonal Safety  Yes, feel physically and emotionally safe where you currently live    Medication  No medication insecurities    Medical Provider  No    Referrals  Area Agency    ED Visit Averted  Not Applicable    Life-Saving Intervention Made  Not Applicable     Complained of numbness in right hand.Has had pain in left shoulder for several weeks.stated she also has sacral-iliac dysfunction.requested to be seen for follow-up. 05-10-17 [facility] with June Wilson appointment for assessment on Monday at 2:30p Jenene SlickerEmma Willia Genrich RN, [facility] [phone]

## 2017-05-30 NOTE — Congregational Nurse Program (Signed)
Congregational Nurse Program Note  Date of Encounter: 05/30/2017  Past Medical History: Past Medical History:  Diagnosis Date  . Acid reflux   . Bipolar 1 disorder (HCC)   . Depression     Encounter Details: CNP Questionnaire - 05/23/17 1745      Questionnaire   Patient Status  Not Applicable    Race  White or Caucasian    Location Patient Served At  [place] [facility] [employer] [employer]    Insurance  Not Applicable    Uninsured  Uninsured (Subsequent visits/quarter)    Food  No food insecurities    Housing/Utilities  No permanent housing    Transportation  No transportation needs    Economistnterpersonal Safety  Yes, feel physically and emotionally safe where you currently live    Medication  No medication insecurities    Medical Provider  No    Referrals  [employer] [facility]    ED Visit Averted  Not Applicable    Life-Saving Intervention Made  Not Applicable     Stated she is still experiencing numbness in her hands. Presently working but [facility]. Told will try to schedule  an appointment for screening on that day B/P 122/79; P [address] Nahshon Reich Aldean AstRN, [facility] [phone].  05-29-17 [facility]CT [facility] spoke Alyssa GrovewithJune Wilson appointment scheduled for 06-12-17 at 2:30p

## 2017-10-20 ENCOUNTER — Other Ambulatory Visit: Payer: Self-pay

## 2017-10-20 ENCOUNTER — Emergency Department (HOSPITAL_COMMUNITY): Payer: BLUE CROSS/BLUE SHIELD

## 2017-10-20 ENCOUNTER — Encounter (HOSPITAL_COMMUNITY): Payer: Self-pay | Admitting: Radiology

## 2017-10-20 ENCOUNTER — Inpatient Hospital Stay (HOSPITAL_COMMUNITY)
Admission: EM | Admit: 2017-10-20 | Discharge: 2017-11-14 | DRG: 082 | Disposition: E | Payer: BLUE CROSS/BLUE SHIELD | Attending: General Surgery | Admitting: General Surgery

## 2017-10-20 DIAGNOSIS — Z885 Allergy status to narcotic agent status: Secondary | ICD-10-CM

## 2017-10-20 DIAGNOSIS — E878 Other disorders of electrolyte and fluid balance, not elsewhere classified: Secondary | ICD-10-CM | POA: Diagnosis present

## 2017-10-20 DIAGNOSIS — R578 Other shock: Secondary | ICD-10-CM | POA: Diagnosis present

## 2017-10-20 DIAGNOSIS — T797XXA Traumatic subcutaneous emphysema, initial encounter: Secondary | ICD-10-CM | POA: Diagnosis present

## 2017-10-20 DIAGNOSIS — G960 Cerebrospinal fluid leak: Secondary | ICD-10-CM | POA: Diagnosis present

## 2017-10-20 DIAGNOSIS — S2231XA Fracture of one rib, right side, initial encounter for closed fracture: Secondary | ICD-10-CM | POA: Diagnosis present

## 2017-10-20 DIAGNOSIS — D72829 Elevated white blood cell count, unspecified: Secondary | ICD-10-CM | POA: Diagnosis present

## 2017-10-20 DIAGNOSIS — T1490XA Injury, unspecified, initial encounter: Secondary | ICD-10-CM

## 2017-10-20 DIAGNOSIS — S04011A Injury of optic nerve, right eye, initial encounter: Secondary | ICD-10-CM | POA: Diagnosis present

## 2017-10-20 DIAGNOSIS — Y929 Unspecified place or not applicable: Secondary | ICD-10-CM | POA: Diagnosis not present

## 2017-10-20 DIAGNOSIS — S04012A Injury of optic nerve, left eye, initial encounter: Secondary | ICD-10-CM | POA: Diagnosis present

## 2017-10-20 DIAGNOSIS — S065X9A Traumatic subdural hemorrhage with loss of consciousness of unspecified duration, initial encounter: Secondary | ICD-10-CM | POA: Diagnosis present

## 2017-10-20 DIAGNOSIS — Z66 Do not resuscitate: Secondary | ICD-10-CM | POA: Diagnosis present

## 2017-10-20 DIAGNOSIS — S061X9A Traumatic cerebral edema with loss of consciousness of unspecified duration, initial encounter: Secondary | ICD-10-CM | POA: Diagnosis present

## 2017-10-20 DIAGNOSIS — S02119A Unspecified fracture of occiput, initial encounter for closed fracture: Secondary | ICD-10-CM | POA: Diagnosis present

## 2017-10-20 DIAGNOSIS — E870 Hyperosmolality and hypernatremia: Secondary | ICD-10-CM | POA: Diagnosis present

## 2017-10-20 DIAGNOSIS — R739 Hyperglycemia, unspecified: Secondary | ICD-10-CM | POA: Diagnosis present

## 2017-10-20 DIAGNOSIS — J9601 Acute respiratory failure with hypoxia: Secondary | ICD-10-CM | POA: Diagnosis present

## 2017-10-20 DIAGNOSIS — D62 Acute posthemorrhagic anemia: Secondary | ICD-10-CM | POA: Diagnosis present

## 2017-10-20 DIAGNOSIS — S022XXA Fracture of nasal bones, initial encounter for closed fracture: Secondary | ICD-10-CM | POA: Diagnosis present

## 2017-10-20 DIAGNOSIS — S0081XA Abrasion of other part of head, initial encounter: Secondary | ICD-10-CM | POA: Diagnosis present

## 2017-10-20 DIAGNOSIS — R402112 Coma scale, eyes open, never, at arrival to emergency department: Secondary | ICD-10-CM | POA: Diagnosis present

## 2017-10-20 DIAGNOSIS — S0240FA Zygomatic fracture, left side, initial encounter for closed fracture: Secondary | ICD-10-CM | POA: Diagnosis present

## 2017-10-20 DIAGNOSIS — S0219XA Other fracture of base of skull, initial encounter for closed fracture: Secondary | ICD-10-CM | POA: Diagnosis present

## 2017-10-20 DIAGNOSIS — S0292XA Unspecified fracture of facial bones, initial encounter for closed fracture: Secondary | ICD-10-CM

## 2017-10-20 DIAGNOSIS — S069XAA Unspecified intracranial injury with loss of consciousness status unknown, initial encounter: Secondary | ICD-10-CM | POA: Diagnosis present

## 2017-10-20 DIAGNOSIS — G9389 Other specified disorders of brain: Secondary | ICD-10-CM

## 2017-10-20 DIAGNOSIS — I6200 Nontraumatic subdural hemorrhage, unspecified: Secondary | ICD-10-CM

## 2017-10-20 DIAGNOSIS — Y999 Unspecified external cause status: Secondary | ICD-10-CM

## 2017-10-20 DIAGNOSIS — Z9911 Dependence on respirator [ventilator] status: Secondary | ICD-10-CM

## 2017-10-20 DIAGNOSIS — R509 Fever, unspecified: Secondary | ICD-10-CM

## 2017-10-20 DIAGNOSIS — Z978 Presence of other specified devices: Secondary | ICD-10-CM

## 2017-10-20 DIAGNOSIS — S066X9A Traumatic subarachnoid hemorrhage with loss of consciousness of unspecified duration, initial encounter: Principal | ICD-10-CM | POA: Diagnosis present

## 2017-10-20 DIAGNOSIS — S069X9A Unspecified intracranial injury with loss of consciousness of unspecified duration, initial encounter: Secondary | ICD-10-CM | POA: Diagnosis present

## 2017-10-20 DIAGNOSIS — R402332 Coma scale, best motor response, abnormal, at arrival to emergency department: Secondary | ICD-10-CM | POA: Diagnosis present

## 2017-10-20 DIAGNOSIS — S020XXA Fracture of vault of skull, initial encounter for closed fracture: Secondary | ICD-10-CM | POA: Diagnosis present

## 2017-10-20 DIAGNOSIS — R402212 Coma scale, best verbal response, none, at arrival to emergency department: Secondary | ICD-10-CM | POA: Diagnosis present

## 2017-10-20 LAB — COMPREHENSIVE METABOLIC PANEL
ALBUMIN: 3.6 g/dL (ref 3.5–5.0)
ALT: 127 U/L — AB (ref 0–44)
AST: 198 U/L — AB (ref 15–41)
Alkaline Phosphatase: 65 U/L (ref 38–126)
Anion gap: 10 (ref 5–15)
BUN: 9 mg/dL (ref 6–20)
CALCIUM: 8.6 mg/dL — AB (ref 8.9–10.3)
CHLORIDE: 110 mmol/L (ref 98–111)
CO2: 22 mmol/L (ref 22–32)
CREATININE: 0.63 mg/dL (ref 0.44–1.00)
GFR calc Af Amer: >60 mL/min (ref >60)
GLUCOSE: 193 mg/dL — AB (ref 70–99)
POTASSIUM: 3.4 mmol/L — AB (ref 3.5–5.1)
SODIUM: 142 mmol/L (ref 135–145)
Total Bilirubin: 0.3 mg/dL (ref 0.3–1.2)
Total Protein: 6 g/dL — ABNORMAL LOW (ref 6.5–8.1)

## 2017-10-20 LAB — I-STAT CG4 LACTIC ACID, ED: Lactic Acid, Venous: 2.05 mmol/L (ref 0.5–1.9)

## 2017-10-20 LAB — I-STAT ARTERIAL BLOOD GAS, ED
Acid-base deficit: 3 mmol/L — ABNORMAL HIGH (ref 0.0–2.0)
Bicarbonate: 20.8 mmol/L (ref 20.0–28.0)
O2 Saturation: 100 %
PH ART: 7.405 (ref 7.350–7.450)
TCO2: 22 mmol/L (ref 22–32)
pCO2 arterial: 33.2 mmHg (ref 32.0–48.0)
pO2, Arterial: 197 mmHg — ABNORMAL HIGH (ref 83.0–108.0)

## 2017-10-20 LAB — TYPE AND SCREEN
ABO/RH(D): O POS
ANTIBODY SCREEN: NEGATIVE
Unit division: 0
Unit division: 0

## 2017-10-20 LAB — URINALYSIS, ROUTINE W REFLEX MICROSCOPIC
BACTERIA UA: NONE SEEN
Bilirubin Urine: NEGATIVE
GLUCOSE, UA: NEGATIVE mg/dL
KETONES UR: NEGATIVE mg/dL
LEUKOCYTES UA: NEGATIVE
Nitrite: NEGATIVE
PROTEIN: NEGATIVE mg/dL
Specific Gravity, Urine: 1.009 (ref 1.005–1.030)
pH: 6 (ref 5.0–8.0)

## 2017-10-20 LAB — CBC
HCT: 36.6 % (ref 36.0–46.0)
HEMOGLOBIN: 11.4 g/dL — AB (ref 12.0–15.0)
MCH: 29.1 pg (ref 26.0–34.0)
MCHC: 31.1 g/dL (ref 30.0–36.0)
MCV: 93.4 fL (ref 78.0–100.0)
PLATELETS: 247 10*3/uL (ref 150–400)
RBC: 3.92 MIL/uL (ref 3.87–5.11)
RDW: 13.3 % (ref 11.5–15.5)
WBC: 25.2 10*3/uL — ABNORMAL HIGH (ref 4.0–10.5)

## 2017-10-20 LAB — I-STAT CHEM 8, ED
BUN: 9 mg/dL (ref 6–20)
CREATININE: 0.6 mg/dL (ref 0.44–1.00)
Calcium, Ion: 1.11 mmol/L — ABNORMAL LOW (ref 1.15–1.40)
Chloride: 109 mmol/L (ref 98–111)
Glucose, Bld: 187 mg/dL — ABNORMAL HIGH (ref 70–99)
HEMATOCRIT: 33 % — AB (ref 36.0–46.0)
HEMOGLOBIN: 11.2 g/dL — AB (ref 12.0–15.0)
POTASSIUM: 3.3 mmol/L — AB (ref 3.5–5.1)
Sodium: 142 mmol/L (ref 135–145)
TCO2: 21 mmol/L — AB (ref 22–32)

## 2017-10-20 LAB — BPAM RBC
BLOOD PRODUCT EXPIRATION DATE: 201910112359
Blood Product Expiration Date: 201910092359
ISSUE DATE / TIME: 201909062054
ISSUE DATE / TIME: 201909062054
UNIT TYPE AND RH: 9500
Unit Type and Rh: 9500

## 2017-10-20 LAB — BPAM FFP
BLOOD PRODUCT EXPIRATION DATE: 201909112359
Blood Product Expiration Date: 201909112359
ISSUE DATE / TIME: 201909062054
ISSUE DATE / TIME: 201909062054
Unit Type and Rh: 6200
Unit Type and Rh: 6200

## 2017-10-20 LAB — ABO/RH: ABO/RH(D): O POS

## 2017-10-20 LAB — PREPARE FRESH FROZEN PLASMA
UNIT DIVISION: 0
Unit division: 0

## 2017-10-20 LAB — ETHANOL

## 2017-10-20 LAB — PROTIME-INR
INR: 1.26
PROTHROMBIN TIME: 15.7 s — AB (ref 11.4–15.2)

## 2017-10-20 MED ORDER — MIDAZOLAM HCL 2 MG/2ML IJ SOLN
INTRAMUSCULAR | Status: AC
  Filled 2017-10-20: qty 4

## 2017-10-20 MED ORDER — MIDAZOLAM HCL 5 MG/5ML IJ SOLN
INTRAMUSCULAR | Status: AC | PRN
  Administered 2017-10-20: 4 mg via INTRAVENOUS

## 2017-10-20 MED ORDER — SODIUM CHLORIDE 0.9 % IV SOLN
INTRAVENOUS | Status: AC | PRN
  Administered 2017-10-20: 1000 mL via INTRAVENOUS

## 2017-10-20 MED ORDER — HYDROMORPHONE HCL 1 MG/ML IJ SOLN: 1.0000 mg | INTRAMUSCULAR | Status: DC | PRN

## 2017-10-20 MED ORDER — MIDAZOLAM HCL 5 MG/5ML IJ SOLN
INTRAMUSCULAR | Status: AC | PRN
  Administered 2017-10-20: 2 mg via INTRAVENOUS

## 2017-10-20 MED ORDER — SUCCINYLCHOLINE CHLORIDE 20 MG/ML IJ SOLN
INTRAMUSCULAR | Status: AC | PRN
  Administered 2017-10-20: 100 mg via INTRAVENOUS

## 2017-10-20 MED ORDER — ETOMIDATE 2 MG/ML IV SOLN
INTRAVENOUS | Status: AC | PRN
  Administered 2017-10-20: 30 mg via INTRAVENOUS

## 2017-10-20 MED ORDER — ONDANSETRON 4 MG PO TBDP: 4.0000 mg | ORAL_TABLET | Freq: Four times a day (QID) | ORAL | Status: DC | PRN

## 2017-10-20 MED ORDER — SODIUM CHLORIDE 0.9 % IV SOLN
INTRAVENOUS | Status: AC | PRN
  Administered 2017-10-20: 125 mL/h via INTRAVENOUS

## 2017-10-20 MED ORDER — HYDRALAZINE HCL 20 MG/ML IJ SOLN: 10.0000 mg | INTRAMUSCULAR | Status: DC | PRN

## 2017-10-20 MED ORDER — DEXTROSE-NACL 5-0.9 % IV SOLN
INTRAVENOUS | Status: DC
  Administered 2017-10-21: via INTRAVENOUS

## 2017-10-20 MED ORDER — SODIUM CHLORIDE 0.9 % IV SOLN
0.5000 mg/h | INTRAVENOUS | Status: DC
  Administered 2017-10-20: 1 mg/h via INTRAVENOUS
  Filled 2017-10-20 (×2): qty 10

## 2017-10-20 MED ORDER — PHENYLEPHRINE HCL-NACL 10-0.9 MG/250ML-% IV SOLN
0.0000 ug/min | INTRAVENOUS | Status: DC
  Administered 2017-10-20: 20 ug/min via INTRAVENOUS
  Filled 2017-10-20: qty 250

## 2017-10-20 MED ORDER — PANTOPRAZOLE SODIUM 40 MG PO TBEC
40.0000 mg | DELAYED_RELEASE_TABLET | Freq: Every day | ORAL | Status: DC
  Filled 2017-10-20: qty 1

## 2017-10-20 MED ORDER — FENTANYL 2500MCG IN NS 250ML (10MCG/ML) PREMIX INFUSION
25.0000 ug/h | INTRAVENOUS | Status: DC
  Administered 2017-10-20: 25 ug/h via INTRAVENOUS
  Administered 2017-10-21: 100 ug/h via INTRAVENOUS
  Administered 2017-10-22: 175 ug/h via INTRAVENOUS
  Filled 2017-10-20 (×3): qty 250

## 2017-10-20 MED ORDER — IOPAMIDOL (ISOVUE-300) INJECTION 61%
100.0000 mL | Freq: Once | INTRAVENOUS | Status: AC | PRN
  Administered 2017-10-20: 100 mL via INTRAVENOUS

## 2017-10-20 MED ORDER — FAMOTIDINE IN NACL 20-0.9 MG/50ML-% IV SOLN
20.0000 mg | Freq: Every day | INTRAVENOUS | Status: DC
  Administered 2017-10-21 – 2017-10-23 (×3): 20 mg via INTRAVENOUS
  Filled 2017-10-20 (×3): qty 50

## 2017-10-20 MED ORDER — PROPOFOL 1000 MG/100ML IV EMUL: 5.0000 ug/kg/min | INTRAVENOUS | Status: DC

## 2017-10-20 MED ORDER — FENTANYL CITRATE (PF) 100 MCG/2ML IJ SOLN
INTRAMUSCULAR | Status: AC | PRN
  Administered 2017-10-20: 50 ug via INTRAVENOUS

## 2017-10-20 MED ORDER — ONDANSETRON HCL 4 MG/2ML IJ SOLN: 4.0000 mg | Freq: Four times a day (QID) | INTRAMUSCULAR | Status: DC | PRN

## 2017-10-20 NOTE — ED Notes (Signed)
Dr Rosalia Hammers informed of lactic acid results 2.05
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
## 2017-10-20 NOTE — ED Notes (Signed)
Pt taken to CT with RNs

## 2017-10-20 NOTE — ED Notes (Signed)
O neg blood at the bedside
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
## 2017-10-20 NOTE — ED Notes (Signed)
Pt BP dropped to 70's systolic, Dr Luisa Hart paged, new order for bolus of 1L NS given.
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
## 2017-10-20 NOTE — ED Notes (Signed)
Labels sent to main lab...blood test HIV and TRG.

## 2017-10-21 ENCOUNTER — Inpatient Hospital Stay (HOSPITAL_COMMUNITY): Payer: BLUE CROSS/BLUE SHIELD

## 2017-10-21 ENCOUNTER — Encounter (HOSPITAL_COMMUNITY): Payer: Self-pay | Admitting: Radiology

## 2017-10-21 LAB — COMPREHENSIVE METABOLIC PANEL
ALBUMIN: 3 g/dL — AB (ref 3.5–5.0)
ALK PHOS: 48 U/L (ref 38–126)
ALT: 94 U/L — ABNORMAL HIGH (ref 0–44)
ANION GAP: 7 (ref 5–15)
AST: 143 U/L — AB (ref 15–41)
BILIRUBIN TOTAL: 0.3 mg/dL (ref 0.3–1.2)
BUN: 6 mg/dL (ref 6–20)
CALCIUM: 7.5 mg/dL — AB (ref 8.9–10.3)
CO2: 21 mmol/L — ABNORMAL LOW (ref 22–32)
CREATININE: 0.66 mg/dL (ref 0.44–1.00)
Chloride: 127 mmol/L — ABNORMAL HIGH (ref 98–111)
GFR calc Af Amer: >60 mL/min (ref >60)
GFR calc non Af Amer: >60 mL/min (ref >60)
GLUCOSE: 172 mg/dL — AB (ref 70–99)
Potassium: 3 mmol/L — ABNORMAL LOW (ref 3.5–5.1)
Sodium: 155 mmol/L — ABNORMAL HIGH (ref 135–145)
Total Protein: 4.8 g/dL — ABNORMAL LOW (ref 6.5–8.1)

## 2017-10-21 LAB — SODIUM
Sodium: 156 mmol/L — ABNORMAL HIGH (ref 135–145)
Sodium: 160 mmol/L — ABNORMAL HIGH (ref 135–145)

## 2017-10-21 LAB — GLUCOSE, CAPILLARY
GLUCOSE-CAPILLARY: 131 mg/dL — AB (ref 70–99)
GLUCOSE-CAPILLARY: 122 mg/dL — AB (ref 70–99)
Glucose-Capillary: 131 mg/dL — ABNORMAL HIGH (ref 70–99)
Glucose-Capillary: 154 mg/dL — ABNORMAL HIGH (ref 70–99)
Glucose-Capillary: 158 mg/dL — ABNORMAL HIGH (ref 70–99)
Glucose-Capillary: 166 mg/dL — ABNORMAL HIGH (ref 70–99)

## 2017-10-21 LAB — CBC
HEMATOCRIT: 29.1 % — AB (ref 36.0–46.0)
HEMOGLOBIN: 9.2 g/dL — AB (ref 12.0–15.0)
MCH: 29.1 pg (ref 26.0–34.0)
MCHC: 31.6 g/dL (ref 30.0–36.0)
MCV: 92.1 fL (ref 78.0–100.0)
Platelets: 169 10*3/uL (ref 150–400)
RBC: 3.16 MIL/uL — ABNORMAL LOW (ref 3.87–5.11)
RDW: 13.3 % (ref 11.5–15.5)
WBC: 19.3 10*3/uL — ABNORMAL HIGH (ref 4.0–10.5)

## 2017-10-21 LAB — CDS SEROLOGY

## 2017-10-21 LAB — TRIGLYCERIDES: Triglycerides: 74 mg/dL (ref <150)

## 2017-10-21 MED ORDER — CHLORHEXIDINE GLUCONATE 0.12% ORAL RINSE (MEDLINE KIT)
15.0000 mL | Freq: Two times a day (BID) | OROMUCOSAL | Status: DC
  Administered 2017-10-21 – 2017-10-23 (×5): 15 mL via OROMUCOSAL

## 2017-10-21 MED ORDER — SODIUM CHLORIDE 0.9 % IV SOLN
1.0000 g | Freq: Once | INTRAVENOUS | Status: AC
  Administered 2017-10-21: 1 g via INTRAVENOUS
  Filled 2017-10-21: qty 10

## 2017-10-21 MED ORDER — KCL IN DEXTROSE-NACL 40-5-0.45 MEQ/L-%-% IV SOLN
INTRAVENOUS | Status: DC
  Administered 2017-10-21: 06:00:00 via INTRAVENOUS
  Filled 2017-10-21: qty 1000

## 2017-10-21 MED ORDER — ORAL CARE MOUTH RINSE
15.0000 mL | OROMUCOSAL | Status: DC
  Administered 2017-10-21 – 2017-10-23 (×22): 15 mL via OROMUCOSAL

## 2017-10-21 MED ORDER — POTASSIUM CHLORIDE 2 MEQ/ML IV SOLN: INTRAVENOUS | Status: DC

## 2017-10-21 MED ORDER — SODIUM CHLORIDE 0.9 % IV BOLUS
500.0000 mL | Freq: Once | INTRAVENOUS | Status: AC
  Administered 2017-10-21: 500 mL via INTRAVENOUS

## 2017-10-21 MED ORDER — IOPAMIDOL (ISOVUE-370) INJECTION 76%
INTRAVENOUS | Status: AC
  Administered 2017-10-21: 50 mL
  Filled 2017-10-21: qty 50

## 2017-10-21 MED ORDER — SODIUM CHLORIDE 3 % IV SOLN
INTRAVENOUS | Status: AC
  Administered 2017-10-21 (×2): 75 mL/h via INTRAVENOUS
  Filled 2017-10-21 (×5): qty 500

## 2017-10-21 MED ORDER — ACETAMINOPHEN 650 MG RE SUPP
650.0000 mg | RECTAL | Status: DC | PRN
  Administered 2017-10-21 – 2017-10-22 (×4): 650 mg via RECTAL
  Filled 2017-10-21 (×4): qty 1

## 2017-10-21 MED ORDER — METOPROLOL TARTRATE 5 MG/5ML IV SOLN
5.0000 mg | Freq: Once | INTRAVENOUS | Status: AC
  Administered 2017-10-21: 5 mg via INTRAVENOUS
  Filled 2017-10-21: qty 5
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
## 2017-10-21 NOTE — Progress Notes (Addendum)
Spoke with Dr. Fredricka Bonine regarding patients continued temp and heart rate in the 140s, orders for cultures, chest xray, and metoprolol received. Ordered to restart fentanyl despite low rass (-4). MD notified that patient had no cough, gag, or corneal reflexes and they were still breathing over the vent and posturing.

## 2017-10-21 NOTE — Plan of Care (Signed)
Patient's neurological exam continues to worsen.
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
## 2017-10-21 NOTE — Progress Notes (Signed)
RT transported pt on vent to 4N room 16. FIO2 100% during transport. Pt transport was uneventful. RT called report to receiving RT [facility].

## 2017-10-21 NOTE — Progress Notes (Signed)
RN went into room for 1400 assessment, noticed right pupil was blown.  On previous assessments, right pupil was 4 and fixed.  Val Eagle NP and Dr. Fredricka Bonine notified. Dr. Fredricka Bonine coming to speak with family to address code status.

## 2017-10-21 NOTE — Progress Notes (Signed)
Patient's HR sustained in the 130s and temp of 101.8 degrees F.  Tylenol given.  Dr. Fredricka Bonine notified and ordered 500 mL bolus NaCl.  Will continue to monitor.

## 2017-10-21 NOTE — Progress Notes (Signed)
Family of the patient are very anxious about the patient's condition and  possible outcome in the future.  Family has stated a number of times that they would like to speak with either the trauma or neurosurgery doctor about what comes next and their possible options.  I will relay this information to both physicians.

## 2017-10-21 NOTE — Progress Notes (Signed)
Foam applied to various scrapes and abrasions on the patient's arms, legs, shoulder, and forehead
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
## 2017-10-21 NOTE — Progress Notes (Signed)
Pt transported to CT via ventilator. Pt remained stable throughout the trip. Pt returned to 4N16. Kaelyn RRT notified of her return to the ICU.

## 2017-10-21 NOTE — Progress Notes (Signed)
Follow up CT with worsening right temporal contusion and associated edema. Also with worsening of Left temporal contusion/ edema to lesser extent   Begin hypertonic saline. More aggressive ICP control.  Given bilateral injury, I do not fell that any decompressive surgery is indicated.  Prognosis very poor

## 2017-10-21 NOTE — Progress Notes (Signed)
Discussed progressively worsened exam and poor prognosis with family at bedside including patient's daughter whom they state is the person who best knows that the patient would want.  We discussed very high chance of mortality, and even if the patient survives, very minimal chance of return to meaningful functional status. We discussed the timeline of her progress and I advised them that the worst of the swelling should pass after about 48 hours, but that after that how long to wait and hope for improvement is not a concrete number.   I asked the family what the patient would want in the event of an acute decompensation/ arrest. The daughter states and rest of family present agrees that she would not want code measures such as chest compressions, code meds or defibrillation.  Code status will be changed to DNR.

## 2017-10-22 ENCOUNTER — Inpatient Hospital Stay: Payer: Self-pay

## 2017-10-22 LAB — SODIUM
Sodium: 164 mmol/L (ref 135–145)
Sodium: 164 mmol/L (ref 135–145)
Sodium: 168 mmol/L (ref 135–145)
Sodium: 169 mmol/L (ref 135–145)

## 2017-10-22 LAB — GLUCOSE, CAPILLARY
GLUCOSE-CAPILLARY: 124 mg/dL — AB (ref 70–99)
GLUCOSE-CAPILLARY: 137 mg/dL — AB (ref 70–99)
GLUCOSE-CAPILLARY: 142 mg/dL — AB (ref 70–99)
Glucose-Capillary: 120 mg/dL — ABNORMAL HIGH (ref 70–99)
Glucose-Capillary: 147 mg/dL — ABNORMAL HIGH (ref 70–99)

## 2017-10-22 MED ORDER — SODIUM CHLORIDE 0.9% FLUSH: 10.0000 mL | INTRAVENOUS | Status: DC | PRN

## 2017-10-22 MED ORDER — SODIUM CHLORIDE 0.9% FLUSH
10.0000 mL | Freq: Two times a day (BID) | INTRAVENOUS | Status: DC
  Administered 2017-10-22 – 2017-10-23 (×2): 10 mL

## 2017-10-22 MED ORDER — KCL IN DEXTROSE-NACL 40-5-0.45 MEQ/L-%-% IV SOLN
INTRAVENOUS | Status: DC
  Administered 2017-10-22 – 2017-10-23 (×3): via INTRAVENOUS
  Filled 2017-10-22 (×4): qty 1000

## 2017-10-22 MED ORDER — CHLORHEXIDINE GLUCONATE CLOTH 2 % EX PADS
6.0000 | MEDICATED_PAD | Freq: Every day | CUTANEOUS | Status: DC
  Administered 2017-10-23: 6 via TOPICAL

## 2017-10-22 NOTE — Progress Notes (Signed)
Critical CRITICAL VALUE ALERT  Critical Value:  Na of 164  Date & Time Notied: 10/22/17 12:48 AM  Provider Notified: Julio Sicks   Orders Received/Actions taken: 3% saline stopped per order, reviewed neuro current neuro exam with MD, no other new orders.
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
## 2017-10-22 NOTE — Plan of Care (Signed)
Patient family seems to understand gravity of patient's illness.
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
## 2017-10-22 NOTE — Progress Notes (Signed)
Eusevio Schriver S RN wasted 20 of versed with Elveria Royals RN
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
## 2017-10-22 NOTE — Progress Notes (Signed)
Left pupil had blown upon 1400 assessment.  Dr. Franky Macho notified and talked to daughter regarding the decline in pt condition.

## 2017-10-22 NOTE — Progress Notes (Signed)
Dr. Donell Beers ordered PICC.  IV team wanted clarification for insertion with pt having a fever.  Dr. Luisa Hart verified it is OK to put in PICC despite pending blood cultures and fever.
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
## 2017-10-23 ENCOUNTER — Inpatient Hospital Stay (HOSPITAL_COMMUNITY): Payer: BLUE CROSS/BLUE SHIELD

## 2017-10-23 LAB — BASIC METABOLIC PANEL
BUN: 19 mg/dL (ref 6–20)
CO2: 23 mmol/L (ref 22–32)
Calcium: 8 mg/dL — ABNORMAL LOW (ref 8.9–10.3)
Creatinine, Ser: 0.91 mg/dL (ref 0.44–1.00)
GFR calc Af Amer: >60 mL/min (ref >60)
GFR calc non Af Amer: >60 mL/min (ref >60)
Glucose, Bld: 196 mg/dL — ABNORMAL HIGH (ref 70–99)
POTASSIUM: 3.9 mmol/L (ref 3.5–5.1)
Sodium: 172 mmol/L (ref 135–145)

## 2017-10-23 LAB — SODIUM: Sodium: 173 mmol/L (ref 135–145)

## 2017-10-23 LAB — CBC
HEMATOCRIT: 26 % — AB (ref 36.0–46.0)
Hemoglobin: 7.7 g/dL — ABNORMAL LOW (ref 12.0–15.0)
MCH: 28.9 pg (ref 26.0–34.0)
MCHC: 29.6 g/dL — ABNORMAL LOW (ref 30.0–36.0)
MCV: 97.7 fL (ref 78.0–100.0)
Platelets: 134 10*3/uL — ABNORMAL LOW (ref 150–400)
RBC: 2.66 MIL/uL — AB (ref 3.87–5.11)
RDW: 14.7 % (ref 11.5–15.5)
WBC: 24.8 10*3/uL — AB (ref 4.0–10.5)

## 2017-10-23 LAB — GLUCOSE, CAPILLARY
GLUCOSE-CAPILLARY: 157 mg/dL — AB (ref 70–99)
GLUCOSE-CAPILLARY: 132 mg/dL — AB (ref 70–99)
Glucose-Capillary: 153 mg/dL — ABNORMAL HIGH (ref 70–99)

## 2017-10-23 LAB — URINE CULTURE: Culture: NO GROWTH

## 2017-10-23 MED ORDER — MORPHINE 100MG IN NS 100ML (1MG/ML) PREMIX INFUSION
1.0000 mg/h | INTRAVENOUS | Status: DC
  Administered 2017-10-23: 1 mg/h via INTRAVENOUS
  Filled 2017-10-23: qty 100

## 2017-10-23 MED ORDER — ACETAMINOPHEN 160 MG/5ML PO SOLN: 650.0000 mg | Freq: Four times a day (QID) | ORAL | Status: DC | PRN

## 2017-10-26 LAB — CULTURE, BLOOD (ROUTINE X 2)
Culture: NO GROWTH
Culture: NO GROWTH

## 2017-10-30 ENCOUNTER — Encounter (HOSPITAL_COMMUNITY): Payer: Self-pay | Admitting: Emergency Medicine

## 2017-11-14 NOTE — Progress Notes (Signed)
CRITICAL VALUE ALERT  Critical Value:  Na. 173  Date & Time Notied:  10/24/2017 0127  Provider Notified: Verlin Dike  Orders Received/Actions taken: None
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
## 2017-11-14 NOTE — Progress Notes (Signed)
Patient ID: [name], female   DOB: 09/11/1965, 52 y.o.   MRN: [id number] CDS notified RN patient is not a DCD candidate. I notified the family and they will let the staff know when they are ready for terminal extubation.  [name], MD, MPH, FACS Trauma: [phone] General Surgery: [phone]
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
## 2017-11-14 NOTE — Progress Notes (Signed)
CRITICAL VALUE ALERT  Critical Value:  Sodium 172  Date & Time Notied:  11/02/2017  0746  Provider Notified: Dr Janee Morn  Orders Received/Actions taken: No orders at this time

## 2017-11-14 NOTE — Progress Notes (Signed)
Pt extubated using withdrawal guidelines.  No distress noted.  RN @ bedside. 

## 2017-11-14 NOTE — Progress Notes (Signed)
Wasted 90 cc Morphine with Heloise Purpura, RN in sink.

## 2017-11-14 NOTE — Progress Notes (Signed)
Wasted 115 ml of fentanyl drip with Jonelle Sidle RN.

## 2017-11-14 NOTE — Progress Notes (Signed)
Patient ID: [name], female   DOB: 1966-02-02, 52 y.o.   MRN: [id number] I met with her three daughters, brothers, and sister, as well as other family members. I updated them on her current status. They communicated Daphne would definitely not want to live hooked up to machines with minimal brain function. RN is contacting [facility] to speak with the family now. We will proceed as appropriate pending that discussion.  Georganna Skeans, MD, MPH, FACS Trauma: [phone] General Surgery: [phone]

## 2017-11-14 DEATH — deceased
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
